# Patient Record
Sex: Male | Born: 1975 | Race: White | Hispanic: No | Marital: Married | Smoking: Never smoker
Health system: Southern US, Community
[De-identification: ages and names within clinical notes are randomized; demographics above are authoritative.]

## PROBLEM LIST (undated history)

## (undated) DIAGNOSIS — I10 Essential (primary) hypertension: Secondary | ICD-10-CM

## (undated) HISTORY — PX: FRACTURE SURGERY: SHX138

## (undated) HISTORY — DX: Essential (primary) hypertension: I10

## (undated) HISTORY — PX: KNEE SURGERY: SHX244

---

## 2007-03-16 ENCOUNTER — Emergency Department (HOSPITAL_COMMUNITY): Admission: EM | Admit: 2007-03-16 | Discharge: 2007-03-16 | Payer: Self-pay | Admitting: Emergency Medicine

## 2010-03-06 ENCOUNTER — Emergency Department (HOSPITAL_COMMUNITY): Admission: EM | Admit: 2010-03-06 | Discharge: 2010-03-07 | Payer: Self-pay | Admitting: Emergency Medicine

## 2010-05-25 ENCOUNTER — Emergency Department: Payer: Self-pay | Admitting: Internal Medicine

## 2012-06-05 ENCOUNTER — Emergency Department (HOSPITAL_COMMUNITY)
Admission: EM | Admit: 2012-06-05 | Discharge: 2012-06-05 | Disposition: A | Discharge status: Home / Self Care | Payer: BC Managed Care – PPO | Attending: Emergency Medicine | Admitting: Emergency Medicine

## 2012-06-05 ENCOUNTER — Emergency Department (HOSPITAL_COMMUNITY): Payer: BC Managed Care – PPO

## 2012-06-05 ENCOUNTER — Encounter (HOSPITAL_COMMUNITY): Payer: Self-pay | Admitting: Emergency Medicine

## 2012-06-05 DIAGNOSIS — Y92410 Unspecified street and highway as the place of occurrence of the external cause: Secondary | ICD-10-CM | POA: Insufficient documentation

## 2012-06-05 DIAGNOSIS — M25579 Pain in unspecified ankle and joints of unspecified foot: Secondary | ICD-10-CM | POA: Insufficient documentation

## 2012-06-05 MED ORDER — NAPROXEN 250 MG PO TABS
500.0000 mg | ORAL_TABLET | Freq: Once | ORAL | Status: AC
  Administered 2012-06-05: 500 mg via ORAL
  Filled 2012-06-05: qty 2

## 2012-06-05 MED ORDER — IBUPROFEN 800 MG PO TABS
800.0000 mg | ORAL_TABLET | Freq: Once | ORAL | Status: DC
  Filled 2012-06-05: qty 1

## 2012-06-05 MED ORDER — CYCLOBENZAPRINE HCL 10 MG PO TABS: 10.0000 mg | ORAL_TABLET | Freq: Two times a day (BID) | ORAL | Status: DC | PRN

## 2012-06-05 NOTE — ED Notes (Signed)
Pt returned from radiology at this time.  

## 2012-06-05 NOTE — ED Notes (Signed)
Matthew Endo,  PA at pt bedside.

## 2012-06-05 NOTE — ED Notes (Signed)
Pt to radiology via stretcher at this time.  
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
## 2012-06-06 NOTE — ED Provider Notes (Signed)
Medical screening examination/treatment/procedure(s) were performed by non-physician practitioner and as supervising physician I was immediately available for consultation/collaboration.  Raeford Razor, MD 06/06/12 [phone]

## 2017-04-20 ENCOUNTER — Ambulatory Visit: Payer: BLUE CROSS/BLUE SHIELD | Admitting: Family Medicine

## 2017-04-27 ENCOUNTER — Encounter: Payer: Self-pay | Admitting: *Deleted

## 2017-04-27 ENCOUNTER — Emergency Department
Admission: EM | Admit: 2017-04-27 | Discharge: 2017-04-27 | Disposition: A | Discharge status: Home / Self Care | Payer: BLUE CROSS/BLUE SHIELD | Attending: Emergency Medicine | Admitting: Emergency Medicine

## 2017-04-27 ENCOUNTER — Emergency Department: Payer: BLUE CROSS/BLUE SHIELD

## 2017-04-27 DIAGNOSIS — R079 Chest pain, unspecified: Secondary | ICD-10-CM

## 2017-04-27 DIAGNOSIS — Z79899 Other long term (current) drug therapy: Secondary | ICD-10-CM | POA: Diagnosis not present

## 2017-04-27 LAB — BASIC METABOLIC PANEL
ANION GAP: 8 (ref 5–15)
BUN: 12 mg/dL (ref 6–20)
CHLORIDE: 104 mmol/L (ref 101–111)
CO2: 25 mmol/L (ref 22–32)
Calcium: 9 mg/dL (ref 8.9–10.3)
Creatinine, Ser: 0.93 mg/dL (ref 0.61–1.24)
GFR calc Af Amer: >60 mL/min (ref >60)
Glucose, Bld: 92 mg/dL (ref 65–99)
POTASSIUM: 3.6 mmol/L (ref 3.5–5.1)
SODIUM: 137 mmol/L (ref 135–145)

## 2017-04-27 LAB — CBC
HEMATOCRIT: 42.8 % (ref 40.0–52.0)
HEMOGLOBIN: 14.9 g/dL (ref 13.0–18.0)
MCH: 31.7 pg (ref 26.0–34.0)
MCHC: 34.7 g/dL (ref 32.0–36.0)
MCV: 91.3 fL (ref 80.0–100.0)
Platelets: 210 10*3/uL (ref 150–440)
RBC: 4.68 MIL/uL (ref 4.40–5.90)
RDW: 12.6 % (ref 11.5–14.5)
WBC: 7.7 10*3/uL (ref 3.8–10.6)

## 2017-04-27 LAB — TROPONIN I: Troponin I: <0.03 ng/mL (ref <0.03)

## 2017-04-27 NOTE — ED Triage Notes (Signed)
States sharp mid chest pain that goes to his left arm and neck that began today, [place], states he also believes his BP was high with no hx, awake and alert in no acute distress

## 2017-04-27 NOTE — ED Provider Notes (Signed)
[facility] Emergency Department Provider Note  ____________________________________________  Time seen: Approximately 9:36 PM  I have reviewed the triage vital signs and the nursing notes.   HISTORY  Chief Complaint Chest Pain    HPI Matthew Hartman is a 41 y.o. male that presents to the emergency department for evaluation of chest pain for one day. He is not having any chest pain currently. Pain earlier was sharp and radiated into his left shoulder. He states that the pain felt like it was in his muscle. Pain was worse when he moved his left arm. He has 7 children and [place] and [place] the [place]. He also bench pressed earlier today. He had a lot of stress at work today. He was telling his Network engineer about his pain and states that the secretary is a hypochondriac and called his mother. His mother told him that he needed to come to the ER. He checks his blood pressures regularly and states they usually run in the 130s/70s-80s. He has no cardiac history. No headache, visual changes, shortness of breath, palpitations, nausea, vomiting, abdominal pain.   History reviewed. No pertinent past medical history.  There are no active problems to display for this patient.   Past Surgical History:  Procedure Laterality Date  . KNEE SURGERY      Prior to Admission medications   Medication Sig Start Date End Date Taking? Authorizing Provider  cyclobenzaprine (FLEXERIL) 10 MG tablet Take 1 tablet (10 mg total) by mouth 2 (two) times daily as needed for muscle spasms. 06/05/12   Domenic Moras, PA-C  Multiple Vitamin (MULTIVITAMIN WITH MINERALS) TABS Take 1 tablet by mouth daily.    [provider]    Allergies Ibuprofen  History reviewed. No pertinent family history.  Social History Social History  Substance Use Topics  . Smoking status: Never Smoker  . Smokeless tobacco: Not on file  . Alcohol use No     Review of  Systems  Constitutional: No fever/chills Cardiovascular: Positive for chest pain. Respiratory: No SOB. Gastrointestinal: No abdominal pain.  No nausea, no vomiting.  Skin: Negative for rash, abrasions, lacerations, ecchymosis. Neurological: Negative for headaches, numbness or tingling   ____________________________________________   PHYSICAL EXAM:  VITAL SIGNS: ED Triage Vitals  Enc Vitals Group     BP 04/27/17 1840 (!) 144/96     Pulse Rate 04/27/17 1840 69     Resp 04/27/17 1840 16     Temp 04/27/17 1840 98.6 F (37 C)     Temp Source 04/27/17 1840 Oral     SpO2 04/27/17 1840 97 %     Weight 04/27/17 1838 230 lb (104.3 kg)     Height 04/27/17 1838 5\' 11"  (1.803 m)     Head Circumference --      Peak Flow --      Pain Score 04/27/17 2023 0     Pain Loc --      Pain Edu? --      Excl. in [place]? --      Constitutional: Alert and oriented. Well appearing and in no acute distress. Eyes: Conjunctivae are normal. PERRL. EOMI. Head: Atraumatic. ENT:      Ears:      Nose: No congestion/rhinnorhea.      Mouth/Throat: Mucous membranes are moist.  Neck: No stridor.  Cardiovascular: Normal rate, regular rhythm.  Good peripheral circulation. Respiratory: Normal respiratory effort without tachypnea or retractions. Lungs CTAB. Good air entry to  the bases with no decreased or absent breath sounds. Gastrointestinal: Bowel sounds 4 quadrants. Soft and nontender to palpation. No guarding or rigidity. No palpable masses. No distention.  Musculoskeletal: Full range of motion to all extremities. No gross deformities appreciated. No chest wall tenderness to palpation. Neurologic:  Normal speech and language. No gross focal neurologic deficits are appreciated.  Skin:  Skin is warm, dry and intact. No rash noted.   ____________________________________________   LABS (all labs ordered are listed, but only abnormal results are displayed)  Labs Reviewed  BASIC METABOLIC PANEL  CBC   TROPONIN I  TROPONIN I   ____________________________________________  EKG   ____________________________________________  RADIOLOGY Robinette Haines, personally viewed and evaluated these images (plain radiographs) as part of my medical decision making, as well as reviewing the written report by the radiologist.  Dg Chest 2 View  Result Date: 04/27/2017 CLINICAL DATA:  Mid chest pain radiating to left arm and neck that began today. EXAM: CHEST  2 VIEW COMPARISON:  05/25/2010 CXR and left rib radiographs FINDINGS: The heart size and mediastinal contours are within normal limits. Both lungs are clear. Chronic healed ninth and tenth rib fractures. No acute osseous appearing abnormality. IMPRESSION: No active cardiopulmonary disease. Electronically Signed   By: Jazminn Pomales Royalty M.D.   On: 04/27/2017 19:06    ____________________________________________    PROCEDURES  Procedure(s) performed:    Procedures    Medications - No data to display   ____________________________________________   INITIAL IMPRESSION / ASSESSMENT AND PLAN / ED COURSE  Pertinent labs & imaging results that were available during my care of the patient were reviewed by me and considered in my medical decision making (see chart for details).  Review of the [place] CSRS was performed in accordance of the [place] prior to dispensing any controlled drugs.    Patient presented to the emergency department for evaluation of chest pain for one day. He has not had any chest pain while in the emergency department. Vital signs, labwork, and exam are reassuring. No troponin elevation. No changes on EKG. No acute cardiopulmonary processes on chest x-ray. Patient is to follow up with PCP as directed. Patient is given ED precautions to return to the ED for any worsening or new symptoms.     ____________________________________________  FINAL CLINICAL IMPRESSION(S) / ED DIAGNOSES  Final diagnoses:  Chest pain,  unspecified type      NEW MEDICATIONS STARTED DURING THIS VISIT:  Discharge Medication List as of 04/27/2017 10:34 PM          This chart was dictated using voice recognition software/Dragon. Despite best efforts to proofread, errors can occur which can change the meaning. Any change was purely unintentional.    Laban Emperor, PA-C 04/27/17 [place], Randall An, MD 04/28/17 [phone]

## 2017-10-14 DIAGNOSIS — Z302 Encounter for sterilization: Secondary | ICD-10-CM | POA: Diagnosis not present

## 2017-10-14 DIAGNOSIS — Z3009 Encounter for other general counseling and advice on contraception: Secondary | ICD-10-CM | POA: Diagnosis not present

## 2017-11-03 DIAGNOSIS — L720 Epidermal cyst: Secondary | ICD-10-CM | POA: Diagnosis not present

## 2017-11-09 DIAGNOSIS — L720 Epidermal cyst: Secondary | ICD-10-CM | POA: Diagnosis not present

## 2017-12-31 ENCOUNTER — Other Ambulatory Visit: Payer: Self-pay

## 2017-12-31 ENCOUNTER — Emergency Department
Admission: EM | Admit: 2017-12-31 | Discharge: 2017-12-31 | Disposition: A | Discharge status: Home / Self Care | Payer: BLUE CROSS/BLUE SHIELD | Attending: Emergency Medicine | Admitting: Emergency Medicine

## 2017-12-31 ENCOUNTER — Emergency Department: Payer: BLUE CROSS/BLUE SHIELD

## 2017-12-31 ENCOUNTER — Encounter: Payer: Self-pay | Admitting: Emergency Medicine

## 2017-12-31 DIAGNOSIS — E785 Hyperlipidemia, unspecified: Secondary | ICD-10-CM | POA: Diagnosis not present

## 2017-12-31 DIAGNOSIS — R110 Nausea: Secondary | ICD-10-CM | POA: Diagnosis not present

## 2017-12-31 DIAGNOSIS — R079 Chest pain, unspecified: Secondary | ICD-10-CM | POA: Insufficient documentation

## 2017-12-31 LAB — BASIC METABOLIC PANEL
Anion gap: 5 (ref 5–15)
BUN: 18 mg/dL (ref 6–20)
CALCIUM: 8.8 mg/dL — AB (ref 8.9–10.3)
CHLORIDE: 109 mmol/L (ref 101–111)
CO2: 26 mmol/L (ref 22–32)
CREATININE: 1.07 mg/dL (ref 0.61–1.24)
GFR calc non Af Amer: >60 mL/min (ref >60)
Glucose, Bld: 107 mg/dL — ABNORMAL HIGH (ref 65–99)
Potassium: 3.9 mmol/L (ref 3.5–5.1)
Sodium: 140 mmol/L (ref 135–145)

## 2017-12-31 LAB — CBC
HEMATOCRIT: 43.6 % (ref 40.0–52.0)
HEMOGLOBIN: 15 g/dL (ref 13.0–18.0)
MCH: 31.4 pg (ref 26.0–34.0)
MCHC: 34.4 g/dL (ref 32.0–36.0)
MCV: 91.2 fL (ref 80.0–100.0)
Platelets: 200 10*3/uL (ref 150–440)
RBC: 4.78 MIL/uL (ref 4.40–5.90)
RDW: 13.1 % (ref 11.5–14.5)
WBC: 7.1 10*3/uL (ref 3.8–10.6)

## 2017-12-31 LAB — LIPID PANEL
CHOL/HDL RATIO: 7.2 ratio
Cholesterol: 252 mg/dL — ABNORMAL HIGH (ref 0–200)
HDL: 35 mg/dL — AB (ref >40)
LDL Cholesterol: 189 mg/dL — ABNORMAL HIGH (ref 0–99)
TRIGLYCERIDES: 141 mg/dL (ref <150)
VLDL: 28 mg/dL (ref 0–40)

## 2017-12-31 LAB — TROPONIN I: Troponin I: <0.03 ng/mL (ref <0.03)

## 2017-12-31 MED ORDER — ATORVASTATIN CALCIUM 10 MG PO TABS: 10.0000 mg | ORAL_TABLET | Freq: Every day | ORAL | 11 refills | Status: DC

## 2017-12-31 NOTE — ED Provider Notes (Addendum)
[facility] Emergency Department Provider Note       Time seen: ----------------------------------------- 7:24 AM on 12/31/2017 -----------------------------------------   I have reviewed the triage vital signs and the nursing notes.  HISTORY   Chief Complaint Chest Pain    HPI Matthew Hartman is a 42 y.o. male with no significant past medical history who presents to the ED for chest pain.  Patient states he woke up with chest pain with some cold sweats and some nausea.  He is not sure if he had some arm pain as well.  He states he was having chest pain yesterday but no other associated symptoms, though started upon awakening about 430 this morning.  He denies any significant discomfort now.    History reviewed. No pertinent past medical history.  There are no active problems to display for this patient.   Past Surgical History:  Procedure Laterality Date  . KNEE SURGERY      Allergies Ibuprofen  Social History Social History   Tobacco Use  . Smoking status: Never Smoker  . Smokeless tobacco: Never Used  Substance Use Topics  . Alcohol use: No  . Drug use: No   Review of Systems Constitutional: Negative for fever. Cardiovascular: Positive for chest pain Respiratory: Negative for shortness of breath. Gastrointestinal: Negative for abdominal pain, positive for recent nausea Musculoskeletal: Negative for back pain. Skin: Negative for rash. Neurological: Negative for headaches, focal weakness or numbness.  All systems negative/normal/unremarkable except as stated in the HPI  ____________________________________________   PHYSICAL EXAM:  VITAL SIGNS: ED Triage Vitals  Enc Vitals Group     BP 12/31/17 0522 (!) 132/92     Pulse Rate 12/31/17 0522 70     Resp 12/31/17 0522 18     Temp 12/31/17 0522 97.7 F (36.5 C)     Temp Source 12/31/17 0522 Oral     SpO2 12/31/17 0522 98 %     Weight 12/31/17 0521 230 lb (104.3 kg)      Height 12/31/17 0521 5\' 11"  (1.803 m)     Head Circumference --      Peak Flow --      Pain Score 12/31/17 0520 1     Pain Loc --      Pain Edu? --      Excl. in [place]? --    Constitutional: Alert and oriented. Well appearing and in no distress. Eyes: Conjunctivae are normal. Normal extraocular movements. ENT   Head: Normocephalic and atraumatic.   Nose: No congestion/rhinnorhea.   Mouth/Throat: Mucous membranes are moist.   Neck: No stridor. Cardiovascular: Normal rate, regular rhythm. No murmurs, rubs, or gallops. Respiratory: Normal respiratory effort without tachypnea nor retractions. Breath sounds are clear and equal bilaterally. No wheezes/rales/rhonchi. Gastrointestinal: Soft and nontender. Normal bowel sounds Musculoskeletal: Nontender with normal range of motion in extremities. No lower extremity tenderness nor edema. Neurologic:  Normal speech and language. No gross focal neurologic deficits are appreciated.  Skin:  Skin is warm, dry and intact. No rash noted. Psychiatric: Mood and affect are normal. Speech and behavior are normal.  ____________________________________________  EKG: Interpreted by me.  Sinus rhythm the rate is 68 bpm, normal PR interval, normal QRS, normal QT.  ____________________________________________  ED COURSE:  As part of my medical decision making, I reviewed the following data within the [place] History obtained from family if available, nursing notes, old chart and ekg, as well as notes from prior ED visits. Patient presented for  chest pain, we will assess with labs and imaging as indicated at this time.   Procedures ____________________________________________   LABS (pertinent positives/negatives)  Labs Reviewed  BASIC METABOLIC PANEL - Abnormal; Notable for the following components:      Result Value   Glucose, Bld 107 (*)    Calcium 8.8 (*)    All other components within normal limits  LIPID PANEL -  Abnormal; Notable for the following components:   Cholesterol 252 (*)    HDL 35 (*)    LDL Cholesterol 189 (*)    All other components within normal limits  CBC  TROPONIN I  TROPONIN I    RADIOLOGY  Chest x-ray is normal  ____________________________________________  DIFFERENTIAL DIAGNOSIS   Musculoskeletal pain, GERD, anxiety, unstable angina unlikely  FINAL ASSESSMENT AND PLAN  Chest pain, hyperlipidemia   Plan: The patient had presented for nonspecific chest pain. Patient's labs are reassuring including repeat troponin but he does have evidence of hyperlipidemia.  Patient's imaging was negative for acute process.  He is low risk for ACS, but I will refer him to cardiology for outpatient follow-up and I will prescribe a statin for him to take.   Laurence Aly, MD   Note: This note was generated in part or whole with voice recognition software. Voice recognition is usually quite accurate but there are transcription errors that can and very often do occur. I apologize for any typographical errors that were not detected and corrected.     Earleen Newport, MD 12/31/17 [id number]    Earleen Newport, MD 12/31/17 [phone]

## 2017-12-31 NOTE — ED Triage Notes (Signed)
Patient ambulatory to triage with steady gait, without difficulty or distress noted; pt reports left sided CP radiating to left shoulder x month accomp by diaphoresis and nausea; hx of same with normal findings

## 2018-04-15 DIAGNOSIS — H15101 Unspecified episcleritis, right eye: Secondary | ICD-10-CM | POA: Diagnosis not present

## 2018-09-24 DIAGNOSIS — M1712 Unilateral primary osteoarthritis, left knee: Secondary | ICD-10-CM | POA: Diagnosis not present

## 2018-10-24 DIAGNOSIS — J101 Influenza due to other identified influenza virus with other respiratory manifestations: Secondary | ICD-10-CM | POA: Diagnosis not present

## 2018-11-04 DIAGNOSIS — D485 Neoplasm of uncertain behavior of skin: Secondary | ICD-10-CM | POA: Diagnosis not present

## 2018-11-04 DIAGNOSIS — D1800 Hemangioma unspecified site: Secondary | ICD-10-CM | POA: Diagnosis not present

## 2018-11-04 DIAGNOSIS — Z1283 Encounter for screening for malignant neoplasm of skin: Secondary | ICD-10-CM | POA: Diagnosis not present

## 2018-11-04 DIAGNOSIS — L578 Other skin changes due to chronic exposure to nonionizing radiation: Secondary | ICD-10-CM | POA: Diagnosis not present

## 2019-06-24 ENCOUNTER — Emergency Department: Payer: BC Managed Care – PPO

## 2019-06-24 ENCOUNTER — Other Ambulatory Visit: Payer: Self-pay

## 2019-06-24 ENCOUNTER — Encounter: Payer: Self-pay | Admitting: Emergency Medicine

## 2019-06-24 ENCOUNTER — Emergency Department
Admission: EM | Admit: 2019-06-24 | Discharge: 2019-06-24 | Disposition: A | Discharge status: Home / Self Care | Payer: BC Managed Care – PPO | Attending: Emergency Medicine | Admitting: Emergency Medicine

## 2019-06-24 DIAGNOSIS — N210 Calculus in bladder: Secondary | ICD-10-CM | POA: Diagnosis not present

## 2019-06-24 DIAGNOSIS — Z79899 Other long term (current) drug therapy: Secondary | ICD-10-CM | POA: Insufficient documentation

## 2019-06-24 DIAGNOSIS — N200 Calculus of kidney: Secondary | ICD-10-CM | POA: Diagnosis not present

## 2019-06-24 DIAGNOSIS — R1031 Right lower quadrant pain: Secondary | ICD-10-CM | POA: Diagnosis not present

## 2019-06-24 LAB — CBC WITH DIFFERENTIAL/PLATELET
Abs Immature Granulocytes: 0.01 10*3/uL (ref 0.00–0.07)
Basophils Absolute: 0 10*3/uL (ref 0.0–0.1)
Basophils Relative: 1 %
Eosinophils Absolute: 0.1 10*3/uL (ref 0.0–0.5)
Eosinophils Relative: 1 %
HCT: 46.6 % (ref 39.0–52.0)
Hemoglobin: 15.8 g/dL (ref 13.0–17.0)
Immature Granulocytes: 0 %
Lymphocytes Relative: 39 %
Lymphs Abs: 2.9 10*3/uL (ref 0.7–4.0)
MCH: 30.3 pg (ref 26.0–34.0)
MCHC: 33.9 g/dL (ref 30.0–36.0)
MCV: 89.3 fL (ref 80.0–100.0)
Monocytes Absolute: 0.6 10*3/uL (ref 0.1–1.0)
Monocytes Relative: 8 %
Neutro Abs: 3.7 10*3/uL (ref 1.7–7.7)
Neutrophils Relative %: 51 %
Platelets: 232 10*3/uL (ref 150–400)
RBC: 5.22 MIL/uL (ref 4.22–5.81)
RDW: 12.2 % (ref 11.5–15.5)
WBC: 7.3 10*3/uL (ref 4.0–10.5)
nRBC: 0 % (ref 0.0–0.2)

## 2019-06-24 LAB — COMPREHENSIVE METABOLIC PANEL
ALT: 26 U/L (ref 0–44)
AST: 25 U/L (ref 15–41)
Albumin: 4.6 g/dL (ref 3.5–5.0)
Alkaline Phosphatase: 57 U/L (ref 38–126)
Anion gap: 8 (ref 5–15)
BUN: 20 mg/dL (ref 6–20)
CO2: 26 mmol/L (ref 22–32)
Calcium: 9.2 mg/dL (ref 8.9–10.3)
Chloride: 105 mmol/L (ref 98–111)
Creatinine, Ser: 1.23 mg/dL (ref 0.61–1.24)
GFR calc Af Amer: >60 mL/min (ref >60)
GFR calc non Af Amer: >60 mL/min (ref >60)
Glucose, Bld: 118 mg/dL — ABNORMAL HIGH (ref 70–99)
Potassium: 4.4 mmol/L (ref 3.5–5.1)
Sodium: 139 mmol/L (ref 135–145)
Total Bilirubin: 0.6 mg/dL (ref 0.3–1.2)
Total Protein: 7.6 g/dL (ref 6.5–8.1)

## 2019-06-24 LAB — LIPASE, BLOOD: Lipase: 29 U/L (ref 11–51)

## 2019-06-24 MED ORDER — SODIUM CHLORIDE 0.9 % IV BOLUS
1000.0000 mL | Freq: Once | INTRAVENOUS | Status: AC
  Administered 2019-06-24: 09:00:00 1000 mL via INTRAVENOUS

## 2019-06-24 MED ORDER — ONDANSETRON HCL 4 MG/2ML IJ SOLN
4.0000 mg | Freq: Once | INTRAMUSCULAR | Status: AC
Start: 2019-06-24 — End: 2019-06-24
  Administered 2019-06-24: 4 mg via INTRAVENOUS
  Filled 2019-06-24: qty 2

## 2019-06-24 MED ORDER — OXYCODONE HCL 5 MG PO TABS: 5.0000 mg | ORAL_TABLET | Freq: Three times a day (TID) | ORAL | 0 refills | Status: AC | PRN

## 2019-06-24 MED ORDER — TAMSULOSIN HCL 0.4 MG PO CAPS: 0.4000 mg | ORAL_CAPSULE | Freq: Every day | ORAL | 0 refills | Status: AC

## 2019-06-24 MED ORDER — HYDROMORPHONE HCL 1 MG/ML IJ SOLN
1.0000 mg | Freq: Once | INTRAMUSCULAR | Status: AC
  Administered 2019-06-24: 09:00:00 1 mg via INTRAVENOUS
  Filled 2019-06-24: qty 1

## 2019-06-24 MED ORDER — ONDANSETRON HCL 4 MG PO TABS: 4.0000 mg | ORAL_TABLET | Freq: Every day | ORAL | 0 refills | Status: DC | PRN

## 2019-06-24 NOTE — ED Notes (Signed)
Patient transported to CT 

## 2019-06-24 NOTE — ED Provider Notes (Signed)
[facility] Emergency Department Provider Note  ____________________________________________   First MD Initiated Contact with Patient 06/24/19 [phone]     (approximate)  I have reviewed the triage vital signs and the nursing notes.   HISTORY  Chief Complaint Flank Pain    HPI Matthew Hartman is a 43 y.o. male otherwise healthy who presents with concerns for kidney stone.  Patient had sudden onset of right flank pain that started this morning that is severe, intermittent, occasionally gets worse on its own, nothing makes it better.  He denies any vomiting but has had some nausea.  He endorses pain that radiates down into his penis but no testicle pain.  He denies any hematuria.  He says that he thinks he had a kidney stone previously but was never seen by a doctor for it.  He denies any fevers.     History reviewed. No pertinent past medical history.  There are no active problems to display for this patient.   Past Surgical History:  Procedure Laterality Date   KNEE SURGERY      Prior to Admission medications   Medication Sig Start Date End Date Taking? Authorizing Provider  atorvastatin (LIPITOR) 10 MG tablet Take 1 tablet (10 mg total) by mouth daily. 12/31/17 12/31/18  Earleen Newport, MD  cyclobenzaprine (FLEXERIL) 10 MG tablet Take 1 tablet (10 mg total) by mouth 2 (two) times daily as needed for muscle spasms. 06/05/12   Domenic Moras, PA-C  Multiple Vitamin (MULTIVITAMIN WITH MINERALS) TABS Take 1 tablet by mouth daily.    [provider]    Allergies Ibuprofen  History reviewed. No pertinent family history.  Social History Social History   Tobacco Use   Smoking status: Never Smoker   Smokeless tobacco: Never Used  Substance Use Topics   Alcohol use: No   Drug use: No      Review of Systems Constitutional: No fever/chills Eyes: No visual changes. ENT: No sore throat. Cardiovascular: Denies chest  pain. Respiratory: Denies shortness of breath. Gastrointestinal: Positive abdominal and right flank pain. Genitourinary: Negative for dysuria. Musculoskeletal: Negative for back pain. Skin: Negative for rash. Neurological: Negative for headaches, focal weakness or numbness. All other ROS negative ____________________________________________   PHYSICAL EXAM:  VITAL SIGNS: ED Triage Vitals  Enc Vitals Group     BP --      Pulse Rate 06/24/19 0828 90     Resp 06/24/19 0828 16     Temp 06/24/19 0828 98.5 F (36.9 C)     Temp Source 06/24/19 0828 Oral     SpO2 06/24/19 0828 99 %     Weight 06/24/19 0827 220 lb (99.8 kg)     Height --      Head Circumference --      Peak Flow --      Pain Score 06/24/19 0827 10     Pain Loc --      Pain Edu? --      Excl. in [place]? --     Constitutional: Alert and oriented. Well appearing but does appear in pain Eyes: Conjunctivae are normal. EOMI. Head: Atraumatic. Nose: No congestion/rhinnorhea. Mouth/Throat: Mucous membranes are moist.   Neck: No stridor. Trachea Midline. FROM Cardiovascular: Normal rate, regular rhythm. Grossly normal heart sounds.  Good peripheral circulation. Respiratory: Normal respiratory effort.  No retractions. Lungs CTAB. Gastrointestinal: Soft.  Tenderness in the right lower abdomen and right flank.  No distention. No abdominal bruits.  Musculoskeletal: No lower extremity  tenderness nor edema.  No joint effusions. Neurologic:  Normal speech and language. No gross focal neurologic deficits are appreciated.  Skin:  Skin is warm, dry and intact. No rash noted. Psychiatric: Mood and affect are normal. Speech and behavior are normal. GU: Deferred   ____________________________________________   LABS (all labs ordered are listed, but only abnormal results are displayed)  Labs Reviewed  COMPREHENSIVE METABOLIC PANEL - Abnormal; Notable for the following components:      Result Value   Glucose, Bld 118 (*)    All  other components within normal limits  CBC WITH DIFFERENTIAL/PLATELET  LIPASE, BLOOD  URINALYSIS, ROUTINE W REFLEX MICROSCOPIC   ____________________________________________ RADIOLOGY   Official radiology report(s): Ct Renal Stone Study  Result Date: 06/24/2019 CLINICAL DATA:  43 year old male with history of acute onset of right-sided flank pain radiating into the right lower quadrant. EXAM: CT ABDOMEN AND PELVIS WITHOUT CONTRAST TECHNIQUE: Multidetector CT imaging of the abdomen and pelvis was performed following the standard protocol without IV contrast. COMPARISON:  No priors. FINDINGS: Lower chest: Unremarkable. Hepatobiliary: No definite cystic or solid hepatic lesions are confidently identified on today's noncontrast CT examination. Unenhanced appearance of the gallbladder is normal. Pancreas: No definite pancreatic mass or peripancreatic fluid collections or inflammatory changes noted on today's noncontrast CT examination. Spleen: Unremarkable. Adrenals/Urinary Tract: 3 mm nonobstructive calculus in the interpolar collecting system of the right kidney. No additional calculi are noted within the collecting system of the left kidney or along the course of either ureter. 3 mm calculus lying dependently in the left side of the urinary bladder. Urinary bladder is otherwise unremarkable in appearance. Unenhanced appearance of the kidneys and bilateral adrenal glands is otherwise unremarkable. Stomach/Bowel: Unenhanced appearance of the stomach is normal. No pathologic dilatation of small bowel or colon. Normal appendix. Vascular/Lymphatic: No atherosclerotic calcifications in the abdominal aorta or pelvic vasculature. No lymphadenopathy noted in the abdomen or pelvis. Reproductive: Prostate gland and seminal vesicles are unremarkable in appearance. Other: No significant volume of ascites.  No pneumoperitoneum. Musculoskeletal: There are no aggressive appearing lytic or blastic lesions noted in the  visualized portions of the skeleton. IMPRESSION: 1. 3 mm nonobstructive calculus in the interpolar collecting system of the right kidney. 2. 3 mm calculus lying dependently in the urinary bladder. 3. No ureteral stones or findings of urinary tract obstruction are noted at this time. Electronically Signed   By: Vinnie Langton M.D.   On: 06/24/2019 09:04    ____________________________________________   PROCEDURES  Procedure(s) performed (including Critical Care):  Procedures   ____________________________________________   INITIAL IMPRESSION / ASSESSMENT AND PLAN / ED COURSE  MAVRIK FREIMARK was evaluated in Emergency Department on 06/24/2019 for the symptoms described in the history of present illness. He was evaluated in the context of the global COVID-19 pandemic, which necessitated consideration that the patient might be at risk for infection with the SARS-CoV-2 virus that causes COVID-19. Institutional protocols and algorithms that pertain to the evaluation of patients at risk for COVID-19 are in a state of rapid change based on information released by regulatory bodies including the CDC and federal and state organizations. These policies and algorithms were followed during the patient's care in the ED.     Patient is a 43 year old who presents with right flank pain intermittently radiating down into his penile region.  Patient does appear uncomfortable.  Patient slightly hypertensive most likely secondary to pain.  Will control patient's pain and do a CT renal given no prior history  of kidney stones diagnosed by a doctor.  Will get urine to evaluate for hematuria, UTI.  Will get electrolytes and kidney function as well to evaluate for AKI.  Labs are reassuring with normal kidney function.  CT scan shows a 3 mm stone in the bladder.  On reassessment of patient he says that his pain resolved prior prior to getting the pain medicine and his pain is now completely resolved.  I explained  that he most likely is feeling better given the stone has not moved into the bladder.  He has not given a urine but declined staying for giving a urine analysis.  He denies any dysuria or hematuria.  He he says that he would prefer to go home given his pain is now resolved.  We will give a course of oxycodone and nausea medicine in case he has additional pain as it transverses the urethra versus the other kidney stone starts to move.  Patient given urology's phone number for follow-up as needed.  I discussed the provisional nature of ED diagnosis, the treatment so far, the ongoing plan of care, follow up appointments and return precautions with the patient and any family or support people present. They expressed understanding and agreed with the plan, discharged home.       ____________________________________________   FINAL CLINICAL IMPRESSION(S) / ED DIAGNOSES   Final diagnoses:  Kidney stone      MEDICATIONS GIVEN DURING THIS VISIT:  Medications  HYDROmorphone (DILAUDID) injection 1 mg (1 mg Intravenous Given 06/24/19 0845)  ondansetron (ZOFRAN) injection 4 mg (4 mg Intravenous Given 06/24/19 0845)  sodium chloride 0.9 % bolus 1,000 mL (1,000 mLs Intravenous New Bag/Given 06/24/19 0844)     ED Discharge Orders         Ordered    oxyCODONE (ROXICODONE) 5 MG immediate release tablet  Every 8 hours PRN     06/24/19 1014    ondansetron (ZOFRAN) 4 MG tablet  Daily PRN     06/24/19 1014    tamsulosin (FLOMAX) 0.4 MG CAPS capsule  Daily     06/24/19 1014           Note:  This document was prepared using Dragon voice recognition software and may include unintentional dictation errors.   Vanessa , MD 06/24/19 [phone]

## 2019-06-24 NOTE — ED Triage Notes (Signed)
Pt here for acute onset right flank pain radiating to RLQ. Hx stones, feels same. + nausea, no vomiting.  No fevers. Appears in pain.

## 2019-06-24 NOTE — Discharge Instructions (Addendum)
You most likely passed a kidney stone.  We are giving you oxycodone to help with pain.  Zofran help with nausea.  The tamsulosin to help with dilated urethra.  Return to the ER for pain with urination, fevers or vomiting or any other concerns.      1. 3 mm nonobstructive calculus in the interpolar collecting system  of the right kidney.  2. 3 mm calculus lying dependently in the urinary bladder.  3. No ureteral stones or findings of urinary tract obstruction are  noted at this time.

## 2019-07-14 ENCOUNTER — Telehealth: Payer: Self-pay | Admitting: Family Medicine

## 2019-07-14 NOTE — Telephone Encounter (Signed)
Called number in chart as well as number in message, no answer. Will try again.

## 2019-07-14 NOTE — Telephone Encounter (Signed)
Copied from [place] [id number]. Topic: Appointment Scheduling - Scheduling Inquiry for Clinic >> Jul 06, 2019 10:52 AM Scherrie Gerlach wrote: Reason for CRM: sister Casandra Doffing sees dr Wynetta Emery along with her husband and dad Dacotah Goad.  She would like to know if Dr Wynetta Emery will make an exception and accept this pt, her brother as a pt as well. Ok to call sis t schedule appt

## 2019-07-14 NOTE — Telephone Encounter (Signed)
That's fine

## 2019-07-21 ENCOUNTER — Ambulatory Visit (INDEPENDENT_AMBULATORY_CARE_PROVIDER_SITE_OTHER): Payer: BC Managed Care – PPO | Admitting: Family Medicine

## 2019-07-21 ENCOUNTER — Encounter: Payer: Self-pay | Admitting: Family Medicine

## 2019-07-21 ENCOUNTER — Other Ambulatory Visit: Payer: Self-pay

## 2019-07-21 VITALS — BP 150/86 | Ht 70.0 in | Wt 225.0 lb

## 2019-07-21 DIAGNOSIS — I10 Essential (primary) hypertension: Secondary | ICD-10-CM | POA: Insufficient documentation

## 2019-07-21 DIAGNOSIS — Z833 Family history of diabetes mellitus: Secondary | ICD-10-CM

## 2019-07-21 DIAGNOSIS — Z87442 Personal history of urinary calculi: Secondary | ICD-10-CM | POA: Insufficient documentation

## 2019-07-21 DIAGNOSIS — E785 Hyperlipidemia, unspecified: Secondary | ICD-10-CM | POA: Insufficient documentation

## 2019-07-21 DIAGNOSIS — E782 Mixed hyperlipidemia: Secondary | ICD-10-CM

## 2019-07-21 MED ORDER — LISINOPRIL 10 MG PO TABS: 10.0000 mg | ORAL_TABLET | Freq: Every day | ORAL | 2 refills | Status: DC

## 2019-07-21 NOTE — Assessment & Plan Note (Signed)
No issues at this time. Will check urine. Call with any concerns.

## 2019-07-21 NOTE — Assessment & Plan Note (Signed)
Not under good control. Will increase to 10mg  and recheck 1 month. Call with any concerns. Continue to monitor.

## 2019-07-21 NOTE — Progress Notes (Signed)
BP (!) 150/86   Ht _0  (1.778 m)   Wt 225 lb (102.1 kg)   BMI 32.28 kg/m    Subjective:    Patient ID: Matthew Hartman, male    DOB: 02-Oct-1975, 43 y.o.   MRN: [id number]  HPI: [name] is a 43 y.o. male who presents today to establish care. He has not seen a doctor in a while. He has not seen a doctor regularly in about 24 years. He usually goes to urgent care.  Chief Complaint  Patient presents with  . Establish Care  . Hypertension    Patient states that with the 31m Lisinopril it usually runs about 135/90   HYPERTENSION / HYPERLIPIDEMIA- has not taken his blood pressure medicine this AM.he notes that about a year ago he woke up with chest pain so he went to the ER. They started him on some lisinopril and atorvastatin. He never took the atorvastatin Satisfied with current treatment? yes Duration of hypertension: chronic BP monitoring frequency: a few times a week- in the 150s/90s BP medication side effects: no Past BP meds: lisinopril Duration of hyperlipidemia: chronic Cholesterol medication side effects: never taken any Cholesterol supplements: none Past cholesterol medications: none Medication compliance: good compliance Aspirin: no Recent stressors: yes Recurrent headaches: no Visual changes: no Palpitations: no Dyspnea: no Chest pain: no Lower extremity edema: no Dizzy/lightheaded: no  Active Ambulatory Problems    Diagnosis Date Noted  . HTN (hypertension) 07/21/2019  . HLD (hyperlipidemia) 07/21/2019  . History of kidney stones 07/21/2019   Resolved Ambulatory Problems    Diagnosis Date Noted  . No Resolved Ambulatory Problems   Past Medical History:  Diagnosis Date  . Hypertension    Past Surgical History:  Procedure Laterality Date  . FRACTURE SURGERY Right    Has pins  . KNEE SURGERY     Outpatient Encounter Medications as of 07/21/2019  Medication Sig  . lisinopril (ZESTRIL) 10 MG tablet Take 1 tablet (10 mg total) by mouth  daily.  . [DISCONTINUED] atorvastatin (LIPITOR) 10 MG tablet Take 1 tablet (10 mg total) by mouth daily.  . [DISCONTINUED] cyclobenzaprine (FLEXERIL) 10 MG tablet Take 1 tablet (10 mg total) by mouth 2 (two) times daily as needed for muscle spasms.  . [DISCONTINUED] lisinopril (ZESTRIL) 5 MG tablet Take 5 mg by mouth daily.  . [DISCONTINUED] Multiple Vitamin (MULTIVITAMIN WITH MINERALS) TABS Take 1 tablet by mouth daily.  . [DISCONTINUED] ondansetron (ZOFRAN) 4 MG tablet Take 1 tablet (4 mg total) by mouth daily as needed for nausea or vomiting.   No facility-administered encounter medications on file as of 07/21/2019.    Allergies  Allergen Reactions  . Ibuprofen Itching and Swelling   Social History   Socioeconomic History  . Marital status: Married    Spouse name: Not on file  . Number of children: Not on file  . Years of education: Not on file  . Highest education level: Not on file  Occupational History  . Not on file  Social Needs  . Financial resource strain: Not on file  . Food insecurity    Worry: Not on file    Inability: Not on file  . Transportation needs    Medical: Not on file    Non-medical: Not on file  Tobacco Use  . Smoking status: Never Smoker  . Smokeless tobacco: Never Used  Substance and Sexual Activity  . Alcohol use: No  . Drug use: No  . Sexual activity: Yes  Birth control/protection: Surgical  Lifestyle  . Physical activity    Days per week: Not on file    Minutes per session: Not on file  . Stress: Not on file  Relationships  . Social Herbalist on phone: Not on file    Gets together: Not on file    Attends religious service: Not on file    Active member of club or organization: Not on file    Attends meetings of clubs or organizations: Not on file    Relationship status: Not on file  Other Topics Concern  . Not on file  Social History Narrative  . Not on file   Family History  Problem Relation Age of Onset  .  Hypertension Mother   . Hyperlipidemia Mother   . Hypertension Father   . Heart attack Father   . Hyperlipidemia Father   . Diabetes Sister   . Heart attack Maternal Grandfather   . Diabetes Maternal Grandfather   . COPD Paternal Grandmother   . Cancer Paternal Grandfather        Stomach  . Down syndrome Son     Review of Systems  Constitutional: Negative.   Respiratory: Negative.   Cardiovascular: Negative.   Musculoskeletal: Negative.   Skin: Negative.   Psychiatric/Behavioral: Negative.     Per HPI unless specifically indicated above     Objective:    BP (!) 150/86   Ht _0  (1.778 m)   Wt 225 lb (102.1 kg)   BMI 32.28 kg/m   Wt Readings from Last 3 Encounters:  07/21/19 225 lb (102.1 kg)  06/24/19 220 lb (99.8 kg)  12/31/17 230 lb (104.3 kg)    Physical Exam Vitals signs and nursing note reviewed.  Constitutional:      General: He is not in acute distress.    Appearance: Normal appearance. He is not ill-appearing, toxic-appearing or diaphoretic.  HENT:     Head: Normocephalic and atraumatic.     Right Ear: External ear normal.     Left Ear: External ear normal.     Nose: Nose normal.     Mouth/Throat:     Mouth: Mucous membranes are moist.     Pharynx: Oropharynx is clear.  Eyes:     General: No scleral icterus.       Right eye: No discharge.        Left eye: No discharge.     Conjunctiva/sclera: Conjunctivae normal.     Pupils: Pupils are equal, round, and reactive to light.  Neck:     Musculoskeletal: Normal range of motion.  Pulmonary:     Effort: Pulmonary effort is normal. No respiratory distress.     Comments: Speaking in full sentences Musculoskeletal: Normal range of motion.  Skin:    Coloration: Skin is not jaundiced or pale.     Findings: No bruising, erythema, lesion or rash.  Neurological:     Mental Status: He is alert and oriented to person, place, and time. Mental status is at baseline.  Psychiatric:        Mood and Affect:  Mood normal.        Behavior: Behavior normal.        Thought Content: Thought content normal.        Judgment: Judgment normal.     Results for orders placed or performed during the hospital encounter of 06/24/19  CBC with Differential  Result Value Ref Range   WBC 7.3 4.0 - 10.5  K/uL   RBC 5.22 4.22 - 5.81 MIL/uL   Hemoglobin 15.8 13.0 - 17.0 g/dL   HCT 46.6 39.0 - 52.0 %   MCV 89.3 80.0 - 100.0 fL   MCH 30.3 26.0 - 34.0 pg   MCHC 33.9 30.0 - 36.0 g/dL   RDW 12.2 11.5 - 15.5 %   Platelets 232 150 - 400 K/uL   nRBC 0.0 0.0 - 0.2 %   Neutrophils Relative % 51 %   Neutro Abs 3.7 1.7 - 7.7 K/uL   Lymphocytes Relative 39 %   Lymphs Abs 2.9 0.7 - 4.0 K/uL   Monocytes Relative 8 %   Monocytes Absolute 0.6 0.1 - 1.0 K/uL   Eosinophils Relative 1 %   Eosinophils Absolute 0.1 0.0 - 0.5 K/uL   Basophils Relative 1 %   Basophils Absolute 0.0 0.0 - 0.1 K/uL   Immature Granulocytes 0 %   Abs Immature Granulocytes 0.01 0.00 - 0.07 K/uL  Comprehensive metabolic panel  Result Value Ref Range   Sodium 139 135 - 145 mmol/L   Potassium 4.4 3.5 - 5.1 mmol/L   Chloride 105 98 - 111 mmol/L   CO2 26 22 - 32 mmol/L   Glucose, Bld 118 (H) 70 - 99 mg/dL   BUN 20 6 - 20 mg/dL   Creatinine, Ser 1.23 0.61 - 1.24 mg/dL   Calcium 9.2 8.9 - 10.3 mg/dL   Total Protein 7.6 6.5 - 8.1 g/dL   Albumin 4.6 3.5 - 5.0 g/dL   AST 25 15 - 41 U/L   ALT 26 0 - 44 U/L   Alkaline Phosphatase 57 38 - 126 U/L   Total Bilirubin 0.6 0.3 - 1.2 mg/dL   GFR calc non Af Amer >60 >60 mL/min   GFR calc Af Amer >60 >60 mL/min   Anion gap 8 5 - 15  Lipase, blood  Result Value Ref Range   Lipase 29 11 - 51 U/L      Assessment & Plan:   Problem List Items Addressed This Visit      Cardiovascular and Mediastinum   HTN (hypertension) - Primary    Not under good control. Will increase to 50m and recheck 1 month. Call with any concerns. Continue to monitor.       Relevant Medications   lisinopril (ZESTRIL) 10 MG  tablet   Other Relevant Orders   CBC with Differential OUT   Comp Met (CMET)   Microalbumin, Urine Waived   TSH     Other   HLD (hyperlipidemia)    Will recheck labs and treat as needed. Call with any concerns. Continue to monitor.       Relevant Medications   lisinopril (ZESTRIL) 10 MG tablet   Other Relevant Orders   CBC with Differential OUT   Comp Met (CMET)   Lipid Panel w/o Chol/HDL Ratio OUT   History of kidney stones    No issues at this time. Will check urine. Call with any concerns.       Relevant Orders   CBC with Differential OUT   Comp Met (CMET)   UA/M w/rflx Culture, Routine    Other Visit Diagnoses    Family history of diabetes mellitus       Will check labs. Await results. Call with any concerns.    Relevant Orders   Bayer DCA Hb A1c Waived   CBC with Differential OUT   Comp Met (CMET)       Follow up plan: Return  in about 4 weeks (around 08/18/2019) for Physical.   . This visit was completed via [employer] due to the restrictions of the COVID-19 pandemic. All issues as above were discussed and addressed. Physical exam was done as above through visual confirmation on D[employer]. If it was felt that the patient should be evaluated in the office, they were directed there. The patient verbally consented to this visit. . Location of the patient: work . Location of the provider: home . Those involved with this call:  . Provider: Park Liter, DO . CMA: Tiffany Reel, CMA . Front Desk/Registration: Don Perking  . Time spent on call: 30 minutes with patient face to face via video conference. More than 50% of this time was spent in counseling and coordination of care. 45 minutes total spent in review of patient's record and preparation of their chart.

## 2019-07-21 NOTE — Assessment & Plan Note (Signed)
Will recheck labs and treat as needed. Call with any concerns. Continue to monitor.

## 2019-08-02 DIAGNOSIS — Z20828 Contact with and (suspected) exposure to other viral communicable diseases: Secondary | ICD-10-CM | POA: Diagnosis not present

## 2019-08-17 ENCOUNTER — Encounter: Payer: Self-pay | Admitting: Family Medicine

## 2019-08-23 DIAGNOSIS — Z20828 Contact with and (suspected) exposure to other viral communicable diseases: Secondary | ICD-10-CM | POA: Diagnosis not present

## 2019-10-28 DIAGNOSIS — Z23 Encounter for immunization: Secondary | ICD-10-CM | POA: Diagnosis not present

## 2019-10-30 ENCOUNTER — Other Ambulatory Visit: Payer: Self-pay | Admitting: Family Medicine

## 2020-01-06 ENCOUNTER — Ambulatory Visit (INDEPENDENT_AMBULATORY_CARE_PROVIDER_SITE_OTHER): Payer: BC Managed Care – PPO | Admitting: Family Medicine

## 2020-01-06 ENCOUNTER — Other Ambulatory Visit: Payer: Self-pay

## 2020-01-06 ENCOUNTER — Encounter: Payer: Self-pay | Admitting: Family Medicine

## 2020-01-06 VITALS — BP 137/87 | HR 68 | Temp 97.5°F | Ht 70.67 in | Wt 228.0 lb

## 2020-01-06 DIAGNOSIS — E782 Mixed hyperlipidemia: Secondary | ICD-10-CM

## 2020-01-06 DIAGNOSIS — I10 Essential (primary) hypertension: Secondary | ICD-10-CM | POA: Diagnosis not present

## 2020-01-06 DIAGNOSIS — Z0000 Encounter for general adult medical examination without abnormal findings: Secondary | ICD-10-CM

## 2020-01-06 LAB — UA/M W/RFLX CULTURE, ROUTINE
Bilirubin, UA: NEGATIVE
Glucose, UA: NEGATIVE
Ketones, UA: NEGATIVE
Leukocytes,UA: NEGATIVE
Nitrite, UA: NEGATIVE
Protein,UA: NEGATIVE
RBC, UA: NEGATIVE
Specific Gravity, UA: 1.02 (ref 1.005–1.030)
Urobilinogen, Ur: 0.2 mg/dL (ref 0.2–1.0)
pH, UA: 5.5 (ref 5.0–7.5)

## 2020-01-06 LAB — MICROALBUMIN, URINE WAIVED
Creatinine, Urine Waived: 100 mg/dL (ref 10–300)
Microalb, Ur Waived: 10 mg/L (ref 0–19)
Microalb/Creat Ratio: <30 mg/g (ref <30)

## 2020-01-06 MED ORDER — LISINOPRIL 10 MG PO TABS: 10.0000 mg | ORAL_TABLET | Freq: Every day | ORAL | 1 refills | Status: DC

## 2020-01-06 NOTE — Assessment & Plan Note (Signed)
Doing OK today. Encouraged him to take his medicine daily. Checking labs today. Await results. Call with any concerns.

## 2020-01-06 NOTE — Patient Instructions (Signed)

## 2020-01-06 NOTE — Addendum Note (Signed)
Addended by: Valerie Roys on: 01/06/2020 08:50 AM   Modules accepted: Orders

## 2020-01-06 NOTE — Assessment & Plan Note (Signed)
Rechecking levels today. Await results. Call with any concerns. Treat as needed.  

## 2020-01-06 NOTE — Progress Notes (Signed)
BP 137/87 (BP Location: Left Arm, Patient Position: Sitting, Cuff Size: Normal)   Pulse 68   Temp (!) 97.5 F (36.4 C) (Oral)   Ht 5' 10.67" (1.795 m)   Wt 228 lb (103.4 kg)   SpO2 100%   BMI 32.10 kg/m    Subjective:    Patient ID: Matthew Hartman, male    DOB: [birth year], 44 y.o.   MRN: [id number]  HPI: Matthew Hartman is a 44 y.o. male presenting on 01/06/2020 for comprehensive medical examination. Current medical complaints include:  HYPERTENSION- has only been taking his medication when he was stressed. He did take his medicine today. Has been doing crossfit every day  Hypertension status: better  Satisfied with current treatment? yes Duration of hypertension: chronic BP monitoring frequency:  a few times a week BP range:  BP medication side effects:  no Medication compliance: poor compliance Previous BP meds: lisinopril Aspirin: no Recurrent headaches: no Visual changes: no Palpitations: no Dyspnea: no Chest pain: no Lower extremity edema: no Dizzy/lightheaded: no  Interim Problems from his last visit: no  Depression Screen done today and results listed below:  Depression screen [facility] 2/9 01/06/2020 07/21/2019  Decreased Interest 0 0  Down, Depressed, Hopeless 0 0  PHQ - 2 Score 0 0  Altered sleeping 0 -  Tired, decreased energy 0 -  Change in appetite 0 -  Feeling bad or failure about yourself  0 -  Trouble concentrating 0 -  Moving slowly or fidgety/restless 0 -  Suicidal thoughts 0 -  PHQ-9 Score 0 -  Difficult doing work/chores Not difficult at all -    Past Medical History:  Past Medical History:  Diagnosis Date  . Hypertension     Surgical History:  Past Surgical History:  Procedure Laterality Date  . FRACTURE SURGERY Right    Has pins  . KNEE SURGERY      Medications:  Current Outpatient Medications on File Prior to Visit  Medication Sig  . lisinopril (ZESTRIL) 10 MG tablet TAKE 1 TABLET BY MOUTH EVERY DAY   No current  facility-administered medications on file prior to visit.    Allergies:  Allergies  Allergen Reactions  . Ibuprofen Itching and Swelling    Social History:  Social History   Socioeconomic History  . Marital status: Married    Spouse name: Not on file  . Number of children: Not on file  . Years of education: Not on file  . Highest education level: Not on file  Occupational History  . Not on file  Tobacco Use  . Smoking status: Never Smoker  . Smokeless tobacco: Never Used  Substance and Sexual Activity  . Alcohol use: No  . Drug use: No  . Sexual activity: Yes    Birth control/protection: Surgical  Other Topics Concern  . Not on file  Social History Narrative  . Not on file   Social Determinants of Health   Financial Resource Strain:   . Difficulty of Paying Living Expenses:   Food Insecurity:   . Worried About Charity fundraiser in the Last Year:   . Arboriculturist in the Last Year:   Transportation Needs:   . Film/video editor (Medical):   Marland Kitchen Lack of Transportation (Non-Medical):   Physical Activity:   . Days of Exercise per Week:   . Minutes of Exercise per Session:   Stress:   . Feeling of Stress :   Social Connections:   .  Frequency of Communication with Friends and Family:   . Frequency of Social Gatherings with Friends and Family:   . Attends Religious Services:   . Active Member of Clubs or Organizations:   . Attends Archivist Meetings:   Marland Kitchen Marital Status:   Intimate Partner Violence:   . Fear of Current or Ex-Partner:   . Emotionally Abused:   Marland Kitchen Physically Abused:   . Sexually Abused:    Social History   Tobacco Use  Smoking Status Never Smoker  Smokeless Tobacco Never Used   Social History   Substance and Sexual Activity  Alcohol Use No    Family History:  Family History  Problem Relation Age of Onset  . Hypertension Mother   . Hyperlipidemia Mother   . Hypertension Father   . Heart attack Father   .  Hyperlipidemia Father   . Diabetes Sister   . Heart attack Maternal Grandfather   . Diabetes Maternal Grandfather   . COPD Paternal Grandmother   . Cancer Paternal Grandfather        Stomach  . Down syndrome Son     Past medical history, surgical history, medications, allergies, family history and social history reviewed with patient today and changes made to appropriate areas of the chart.   Review of Systems  Constitutional: Negative.   HENT: Negative.   Eyes: Negative.        Starting to have trouble seeing at night- having some issues, seeing eye doctor   Respiratory: Negative.   Cardiovascular: Negative.   Gastrointestinal: Negative.        Occasional proctus fugix  Genitourinary: Negative.   Musculoskeletal: Positive for joint pain and myalgias. Negative for back pain, falls and neck pain.  Skin: Negative.   Neurological: Negative.   Endo/Heme/Allergies: Negative.   Psychiatric/Behavioral: Negative.     All other ROS negative except what is listed above and in the HPI.      Objective:    BP 137/87 (BP Location: Left Arm, Patient Position: Sitting, Cuff Size: Normal)   Pulse 68   Temp (!) 97.5 F (36.4 C) (Oral)   Ht 5' 10.67" (1.795 m)   Wt 228 lb (103.4 kg)   SpO2 100%   BMI 32.10 kg/m   Wt Readings from Last 3 Encounters:  01/06/20 228 lb (103.4 kg)  07/21/19 225 lb (102.1 kg)  06/24/19 220 lb (99.8 kg)    Physical Exam  Results for orders placed or performed during the hospital encounter of 06/24/19  CBC with Differential  Result Value Ref Range   WBC 7.3 4.0 - 10.5 K/uL   RBC 5.22 4.22 - 5.81 MIL/uL   Hemoglobin 15.8 13.0 - 17.0 g/dL   HCT 46.6 39.0 - 52.0 %   MCV 89.3 80.0 - 100.0 fL   MCH 30.3 26.0 - 34.0 pg   MCHC 33.9 30.0 - 36.0 g/dL   RDW 12.2 11.5 - 15.5 %   Platelets 232 150 - 400 K/uL   nRBC 0.0 0.0 - 0.2 %   Neutrophils Relative % 51 %   Neutro Abs 3.7 1.7 - 7.7 K/uL   Lymphocytes Relative 39 %   Lymphs Abs 2.9 0.7 - 4.0 K/uL    Monocytes Relative 8 %   Monocytes Absolute 0.6 0.1 - 1.0 K/uL   Eosinophils Relative 1 %   Eosinophils Absolute 0.1 0.0 - 0.5 K/uL   Basophils Relative 1 %   Basophils Absolute 0.0 0.0 - 0.1 K/uL   Immature Granulocytes  0 %   Abs Immature Granulocytes 0.01 0.00 - 0.07 K/uL  Comprehensive metabolic panel  Result Value Ref Range   Sodium 139 135 - 145 mmol/L   Potassium 4.4 3.5 - 5.1 mmol/L   Chloride 105 98 - 111 mmol/L   CO2 26 22 - 32 mmol/L   Glucose, Bld 118 (H) 70 - 99 mg/dL   BUN 20 6 - 20 mg/dL   Creatinine, Ser 1.23 0.61 - 1.24 mg/dL   Calcium 9.2 8.9 - 10.3 mg/dL   Total Protein 7.6 6.5 - 8.1 g/dL   Albumin 4.6 3.5 - 5.0 g/dL   AST 25 15 - 41 U/L   ALT 26 0 - 44 U/L   Alkaline Phosphatase 57 38 - 126 U/L   Total Bilirubin 0.6 0.3 - 1.2 mg/dL   GFR calc non Af Amer >60 >60 mL/min   GFR calc Af Amer >60 >60 mL/min   Anion gap 8 5 - 15  Lipase, blood  Result Value Ref Range   Lipase 29 11 - 51 U/L      Assessment & Plan:   Problem List Items Addressed This Visit      Cardiovascular and Mediastinum   HTN (hypertension)    Doing OK today. Encouraged him to take his medicine daily. Checking labs today. Await results. Call with any concerns.       Relevant Orders   Comprehensive metabolic panel   Microalbumin, Urine Waived     Other   HLD (hyperlipidemia)    Rechecking levels today. Await results. Call with any concerns. Treat as needed.       Relevant Orders   Comprehensive metabolic panel   Lipid Panel w/o Chol/HDL Ratio    Other Visit Diagnoses    Routine general medical examination at a health care facility    -  Primary   Vaccines up to date/declined. Screening labs checked today. Continue diet and exercise. Call with any concerns. Continue to monitor.    Relevant Orders   CBC with Differential/Platelet   Comprehensive metabolic panel   Lipid Panel w/o Chol/HDL Ratio   Microalbumin, Urine Waived   TSH   UA/M w/rflx Culture, Routine        LABORATORY TESTING:  Health maintenance labs ordered today as discussed above.   IMMUNIZATIONS:   - Tdap: Tetanus vaccination status reviewed: last tetanus booster within 10 years. - Influenza: Up to date - Pneumovax: Not applicable  PATIENT COUNSELING:    Sexuality: Discussed sexually transmitted diseases, partner selection, use of condoms, avoidance of unintended pregnancy  and contraceptive alternatives.   Advised to avoid cigarette smoking.  I discussed with the patient that most people either abstain from alcohol or drink within safe limits (<=14/week and <=4 drinks/occasion for males, <=7/weeks and <= 3 drinks/occasion for females) and that the risk for alcohol disorders and other health effects rises proportionally with the number of drinks per week and how often a drinker exceeds daily limits.  Discussed cessation/primary prevention of drug use and availability of treatment for abuse.   Diet: Encouraged to adjust caloric intake to maintain  or achieve ideal body weight, to reduce intake of dietary saturated fat and total fat, to limit sodium intake by avoiding high sodium foods and not adding table salt, and to maintain adequate dietary potassium and calcium preferably from fresh fruits, vegetables, and low-fat dairy products.    stressed the importance of regular exercise  Injury prevention: Discussed safety belts, safety helmets, smoke detector, smoking near  bedding or upholstery.   Dental health: Discussed importance of regular tooth brushing, flossing, and dental visits.   Follow up plan: NEXT PREVENTATIVE PHYSICAL DUE IN 1 YEAR. Return in about 6 months (around 07/07/2020).

## 2020-01-07 LAB — COMPREHENSIVE METABOLIC PANEL
ALT: 25 IU/L (ref 0–44)
AST: 27 IU/L (ref 0–40)
Albumin/Globulin Ratio: 2.1 (ref 1.2–2.2)
Albumin: 4.5 g/dL (ref 4.0–5.0)
Alkaline Phosphatase: 62 IU/L (ref 39–117)
BUN/Creatinine Ratio: 19 (ref 9–20)
BUN: 22 mg/dL (ref 6–24)
Bilirubin Total: 0.3 mg/dL (ref 0.0–1.2)
CO2: 22 mmol/L (ref 20–29)
Calcium: 9.1 mg/dL (ref 8.7–10.2)
Chloride: 105 mmol/L (ref 96–106)
Creatinine, Ser: 1.15 mg/dL (ref 0.76–1.27)
GFR calc Af Amer: 90 mL/min/{1.73_m2} (ref >59)
GFR calc non Af Amer: 78 mL/min/{1.73_m2} (ref >59)
Globulin, Total: 2.1 g/dL (ref 1.5–4.5)
Glucose: 79 mg/dL (ref 65–99)
Potassium: 4.6 mmol/L (ref 3.5–5.2)
Sodium: 140 mmol/L (ref 134–144)
Total Protein: 6.6 g/dL (ref 6.0–8.5)

## 2020-01-07 LAB — CBC WITH DIFFERENTIAL/PLATELET
Basophils Absolute: 0 10*3/uL (ref 0.0–0.2)
Basos: 0 %
EOS (ABSOLUTE): 0.1 10*3/uL (ref 0.0–0.4)
Eos: 1 %
Hematocrit: 45.4 % (ref 37.5–51.0)
Hemoglobin: 14.9 g/dL (ref 13.0–17.7)
Immature Grans (Abs): 0 10*3/uL (ref 0.0–0.1)
Immature Granulocytes: 0 %
Lymphocytes Absolute: 1.7 10*3/uL (ref 0.7–3.1)
Lymphs: 17 %
MCH: 30.9 pg (ref 26.6–33.0)
MCHC: 32.8 g/dL (ref 31.5–35.7)
MCV: 94 fL (ref 79–97)
Monocytes Absolute: 0.7 10*3/uL (ref 0.1–0.9)
Monocytes: 7 %
Neutrophils Absolute: 7.5 10*3/uL — ABNORMAL HIGH (ref 1.4–7.0)
Neutrophils: 75 %
Platelets: 206 10*3/uL (ref 150–450)
RBC: 4.82 x10E6/uL (ref 4.14–5.80)
RDW: 12.7 % (ref 11.6–15.4)
WBC: 9.9 10*3/uL (ref 3.4–10.8)

## 2020-01-07 LAB — LIPID PANEL W/O CHOL/HDL RATIO
Cholesterol, Total: 219 mg/dL — ABNORMAL HIGH (ref 100–199)
HDL: 49 mg/dL (ref >39)
LDL Chol Calc (NIH): 134 mg/dL — ABNORMAL HIGH (ref 0–99)
Triglycerides: 200 mg/dL — ABNORMAL HIGH (ref 0–149)
VLDL Cholesterol Cal: 36 mg/dL (ref 5–40)

## 2020-01-07 LAB — TSH: TSH: 4.17 u[IU]/mL (ref 0.450–4.500)

## 2020-01-09 ENCOUNTER — Encounter: Payer: Self-pay | Admitting: Family Medicine

## 2020-01-10 DIAGNOSIS — M9901 Segmental and somatic dysfunction of cervical region: Secondary | ICD-10-CM | POA: Diagnosis not present

## 2020-01-10 DIAGNOSIS — M5413 Radiculopathy, cervicothoracic region: Secondary | ICD-10-CM | POA: Diagnosis not present

## 2020-01-11 ENCOUNTER — Telehealth: Payer: Self-pay | Admitting: Family Medicine

## 2020-01-11 DIAGNOSIS — M5412 Radiculopathy, cervical region: Secondary | ICD-10-CM | POA: Diagnosis not present

## 2020-01-11 DIAGNOSIS — M9901 Segmental and somatic dysfunction of cervical region: Secondary | ICD-10-CM | POA: Diagnosis not present

## 2020-01-11 DIAGNOSIS — M5413 Radiculopathy, cervicothoracic region: Secondary | ICD-10-CM | POA: Diagnosis not present

## 2020-01-11 NOTE — Telephone Encounter (Signed)
error 

## 2020-01-13 DIAGNOSIS — M5412 Radiculopathy, cervical region: Secondary | ICD-10-CM | POA: Diagnosis not present

## 2020-01-23 DIAGNOSIS — M5412 Radiculopathy, cervical region: Secondary | ICD-10-CM | POA: Diagnosis not present

## 2020-01-27 DIAGNOSIS — M5412 Radiculopathy, cervical region: Secondary | ICD-10-CM | POA: Diagnosis not present

## 2020-01-28 IMAGING — CT CT RENAL STONE PROTOCOL
2 of 4 series · 16 of 46 positions shown, 18 images · non-contrast
Comparison: No priors.

CLINICAL DATA: 43-year-old male with history of acute onset of
right-sided flank pain radiating into the right lower quadrant.

EXAM:
CT ABDOMEN AND PELVIS WITHOUT CONTRAST
TECHNIQUE: Multidetector CT imaging of the abdomen and pelvis was performed
following the standard protocol without IV contrast.

[Series 2: stone full standard · axial · 0.80mm/px · z∈[-558,-83]mm · 13 of 105 slices shown, 15 images]
[im 5/105  soft-tissue]
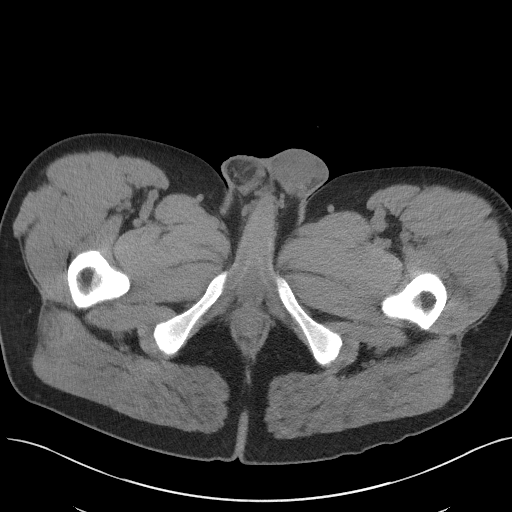
[im 5/105  bone]
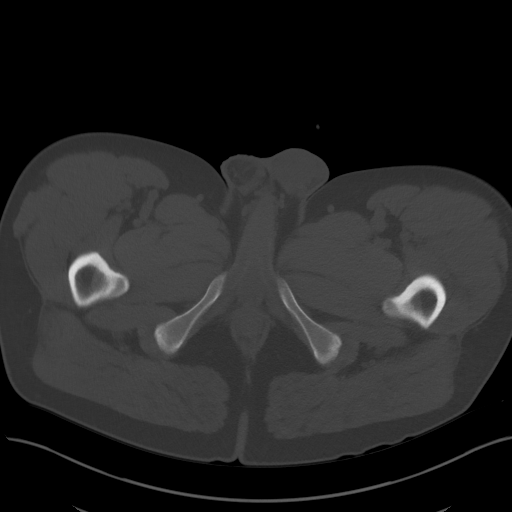
[im 14/105  soft-tissue]
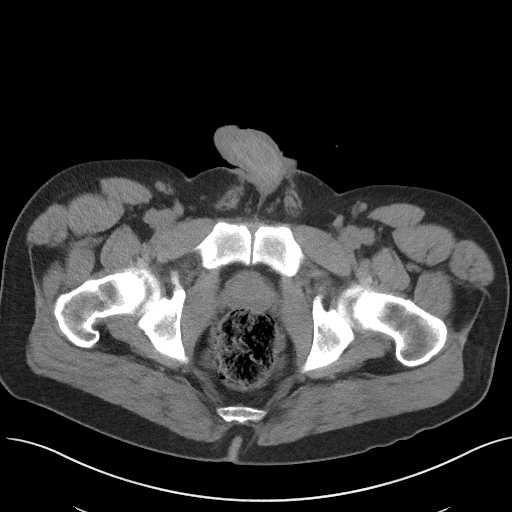
[im 22/105  soft-tissue]
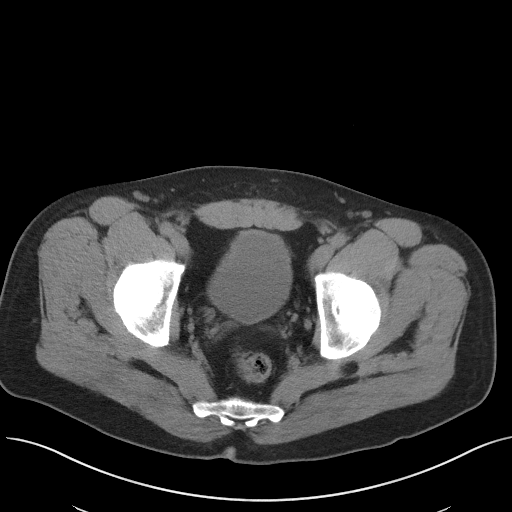
[im 31/105  soft-tissue]
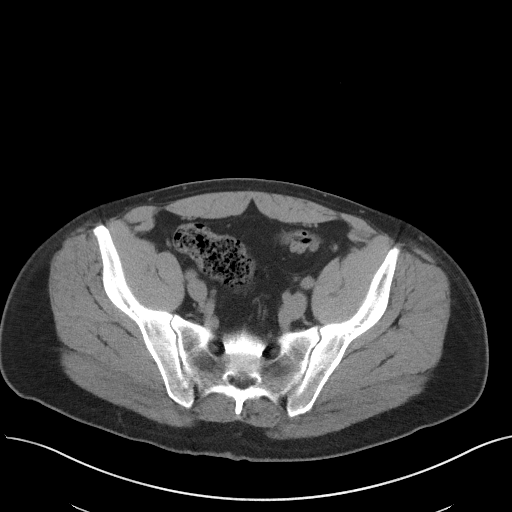
[im 35/105  soft-tissue]
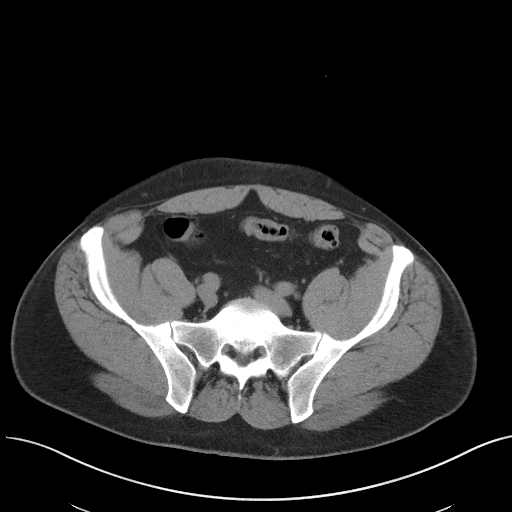
[im 44/105  soft-tissue]
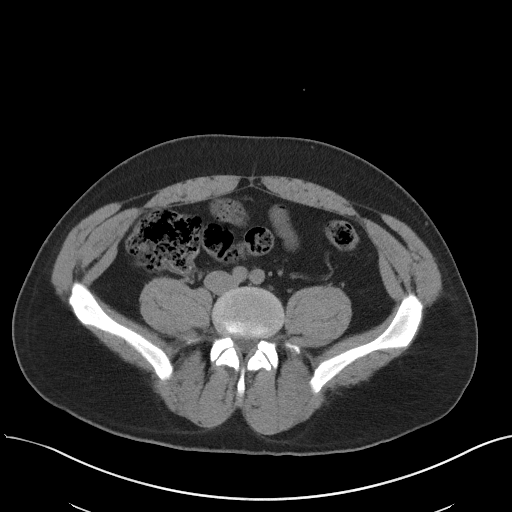
[im 53/105  soft-tissue]
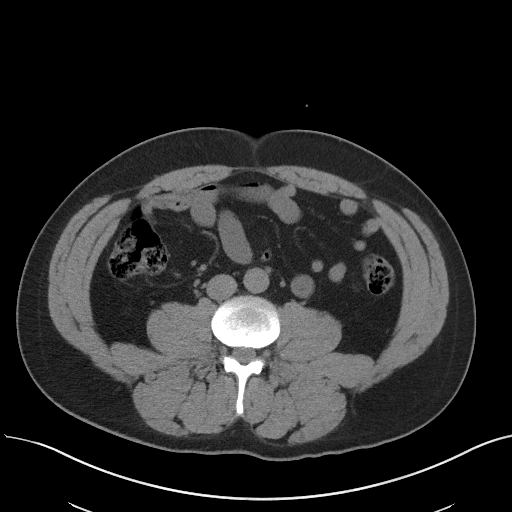
[im 61/105  soft-tissue]
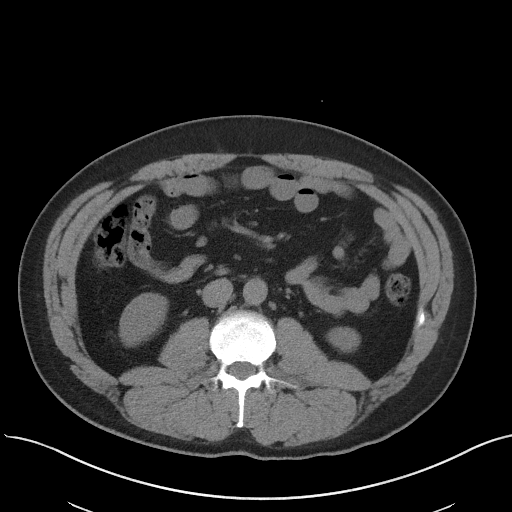
[im 70/105  soft-tissue]
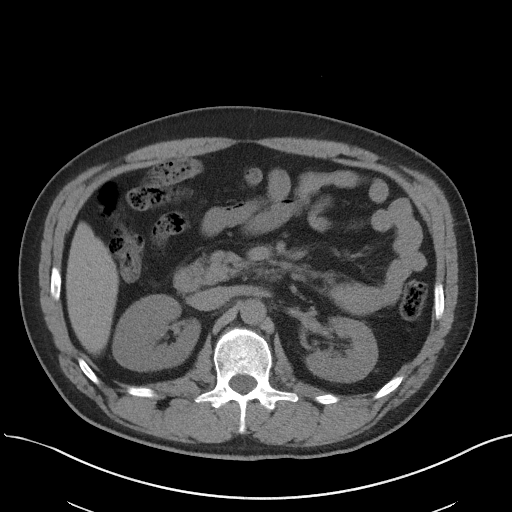
[im 70/105  bone]
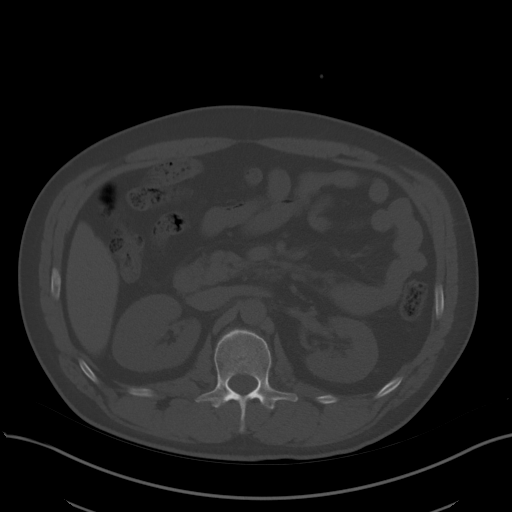
[im 74/105  soft-tissue]
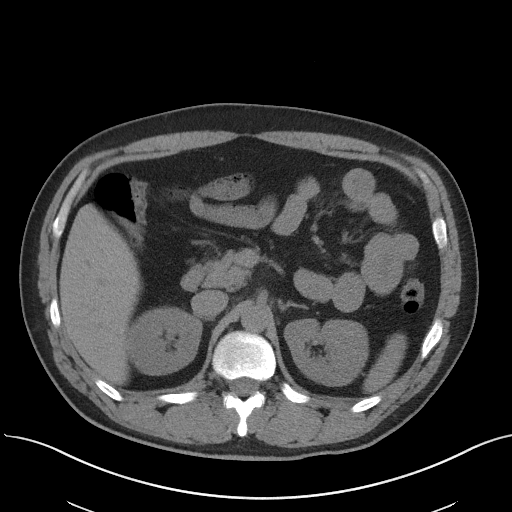
[im 83/105  soft-tissue]
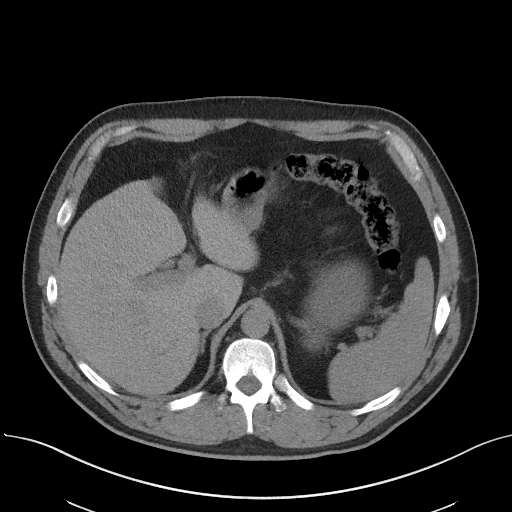
[im 92/105  soft-tissue]
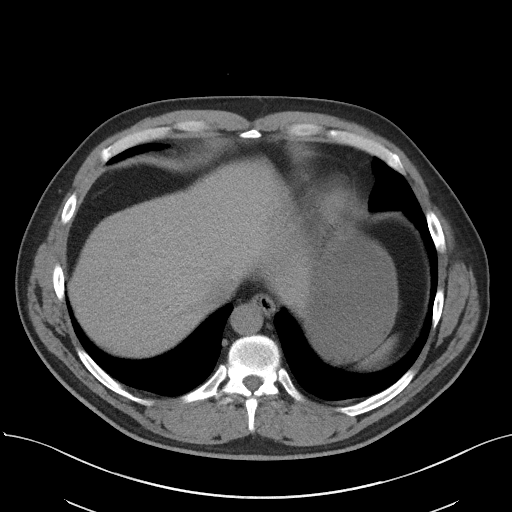
[im 100/105  soft-tissue]
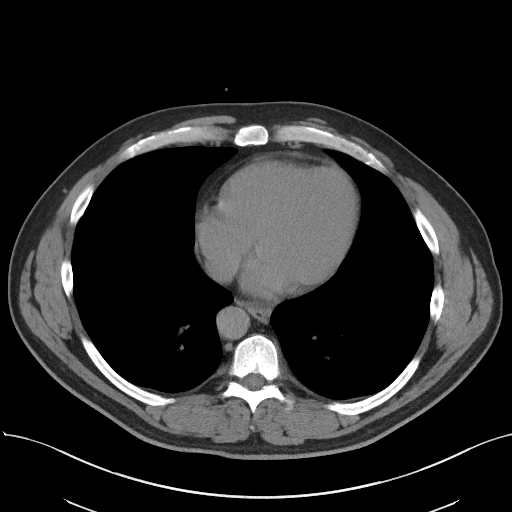

[Series 5: coronal · coronal · 0.81mm/px · 3 of 143 slices shown]
[im 48/143  soft-tissue]
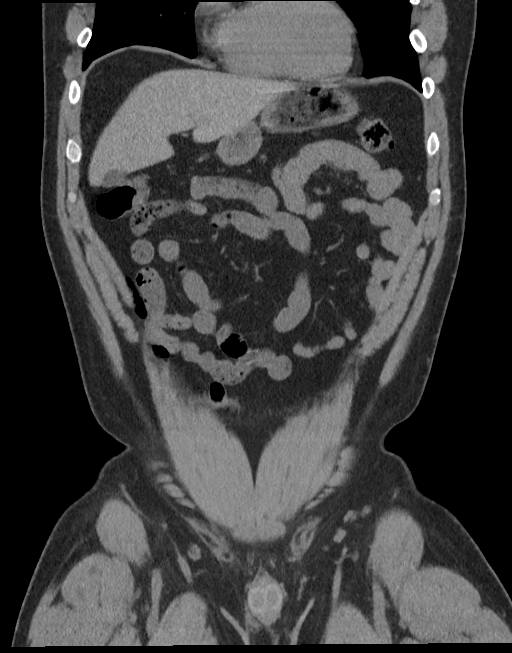
[im 64/143  soft-tissue]
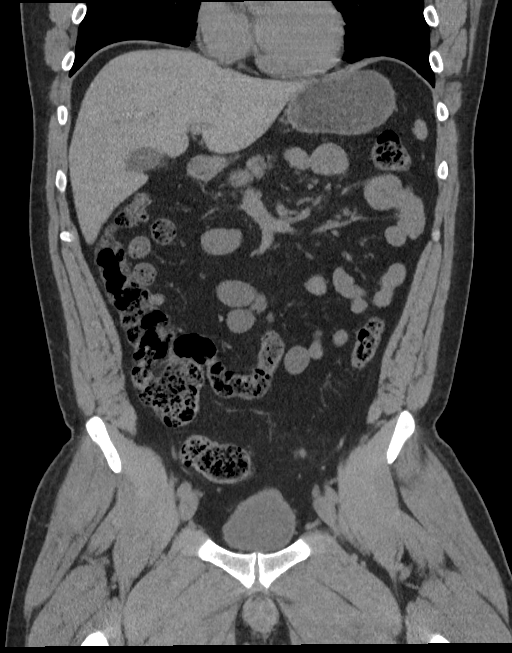
[im 79/143  soft-tissue]
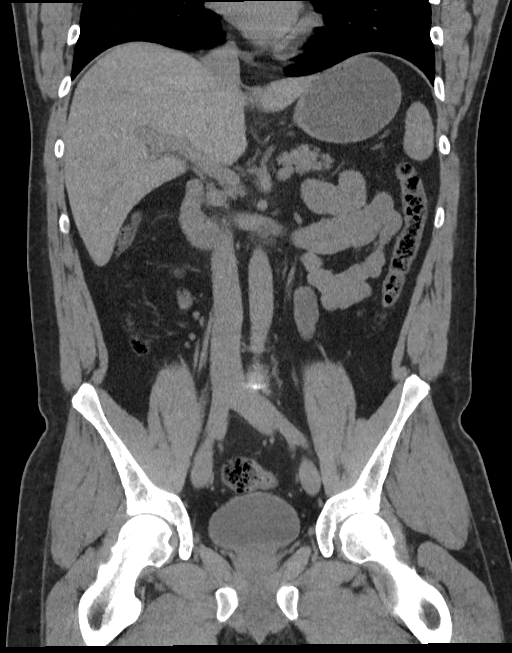

[16 of 46 positions shown; findings below may reference images not displayed]

FINDINGS: Lower chest: Unremarkable.

Hepatobiliary: No definite cystic or solid hepatic lesions are
confidently identified on today's noncontrast CT examination.
Unenhanced appearance of the gallbladder is normal.

Pancreas: No definite pancreatic mass or peripancreatic fluid
collections or inflammatory changes noted on today's noncontrast CT
examination.

Spleen: Unremarkable.

Adrenals/Urinary Tract: 3 mm nonobstructive calculus in the
interpolar collecting system of the right kidney. No additional
calculi are noted within the collecting system of the left kidney or
along the course of either ureter. 3 mm calculus lying dependently
in the left side of the urinary bladder. Urinary bladder is
otherwise unremarkable in appearance. Unenhanced appearance of the
kidneys and bilateral adrenal glands is otherwise unremarkable.

Stomach/Bowel: Unenhanced appearance of the stomach is normal. No
pathologic dilatation of small bowel or colon. Normal appendix.

Vascular/Lymphatic: No atherosclerotic calcifications in the
abdominal aorta or pelvic vasculature. No lymphadenopathy noted in
the abdomen or pelvis.

Reproductive: Prostate gland and seminal vesicles are unremarkable
in appearance.

Other: No significant volume of ascites.  No pneumoperitoneum.

Musculoskeletal: There are no aggressive appearing lytic or blastic
lesions noted in the visualized portions of the skeleton.
IMPRESSION: 1. 3 mm nonobstructive calculus in the interpolar collecting system
of the right kidney.
2. 3 mm calculus lying dependently in the urinary bladder.
3. No ureteral stones or findings of urinary tract obstruction are
noted at this time.

## 2020-01-31 DIAGNOSIS — M5020 Other cervical disc displacement, unspecified cervical region: Secondary | ICD-10-CM | POA: Diagnosis not present

## 2020-01-31 DIAGNOSIS — M5412 Radiculopathy, cervical region: Secondary | ICD-10-CM | POA: Diagnosis not present

## 2020-02-08 DIAGNOSIS — Z01818 Encounter for other preprocedural examination: Secondary | ICD-10-CM | POA: Diagnosis not present

## 2020-02-08 DIAGNOSIS — M5412 Radiculopathy, cervical region: Secondary | ICD-10-CM | POA: Diagnosis not present

## 2020-02-13 DIAGNOSIS — M5412 Radiculopathy, cervical region: Secondary | ICD-10-CM | POA: Diagnosis not present

## 2020-02-13 DIAGNOSIS — Z7952 Long term (current) use of systemic steroids: Secondary | ICD-10-CM | POA: Diagnosis not present

## 2020-02-13 DIAGNOSIS — I10 Essential (primary) hypertension: Secondary | ICD-10-CM | POA: Diagnosis not present

## 2020-02-13 DIAGNOSIS — Z886 Allergy status to analgesic agent status: Secondary | ICD-10-CM | POA: Diagnosis not present

## 2020-02-13 DIAGNOSIS — Z87442 Personal history of urinary calculi: Secondary | ICD-10-CM | POA: Diagnosis not present

## 2020-02-13 DIAGNOSIS — M50223 Other cervical disc displacement at C6-C7 level: Secondary | ICD-10-CM | POA: Diagnosis not present

## 2020-02-14 DIAGNOSIS — M50223 Other cervical disc displacement at C6-C7 level: Secondary | ICD-10-CM | POA: Diagnosis not present

## 2020-02-14 DIAGNOSIS — Z886 Allergy status to analgesic agent status: Secondary | ICD-10-CM | POA: Diagnosis not present

## 2020-02-14 DIAGNOSIS — Z7952 Long term (current) use of systemic steroids: Secondary | ICD-10-CM | POA: Diagnosis not present

## 2020-02-14 DIAGNOSIS — M5412 Radiculopathy, cervical region: Secondary | ICD-10-CM | POA: Diagnosis not present

## 2020-02-14 DIAGNOSIS — Z87442 Personal history of urinary calculi: Secondary | ICD-10-CM | POA: Diagnosis not present

## 2020-02-14 DIAGNOSIS — I10 Essential (primary) hypertension: Secondary | ICD-10-CM | POA: Diagnosis not present

## 2020-03-06 DIAGNOSIS — M50223 Other cervical disc displacement at C6-C7 level: Secondary | ICD-10-CM | POA: Diagnosis not present

## 2020-03-06 DIAGNOSIS — Z4789 Encounter for other orthopedic aftercare: Secondary | ICD-10-CM | POA: Diagnosis not present

## 2020-03-06 DIAGNOSIS — Z9889 Other specified postprocedural states: Secondary | ICD-10-CM | POA: Diagnosis not present

## 2020-03-13 DIAGNOSIS — M542 Cervicalgia: Secondary | ICD-10-CM | POA: Diagnosis not present

## 2020-03-20 DIAGNOSIS — M542 Cervicalgia: Secondary | ICD-10-CM | POA: Diagnosis not present

## 2020-03-28 DIAGNOSIS — Z4889 Encounter for other specified surgical aftercare: Secondary | ICD-10-CM | POA: Diagnosis not present

## 2021-10-11 ENCOUNTER — Ambulatory Visit: Admission: EM | Admit: 2021-10-11 | Payer: BC Managed Care – PPO | Source: Home / Self Care

## 2022-02-11 ENCOUNTER — Ambulatory Visit: Payer: Self-pay

## 2022-02-11 NOTE — Telephone Encounter (Signed)
LVM asking patient to call back to schedule an appt 

## 2022-02-11 NOTE — Telephone Encounter (Signed)
Please call and schedule the patient an appointment to come in and be evaluated.

## 2022-02-11 NOTE — Telephone Encounter (Signed)
  Chief Complaint: Left side abdominal pain shooting to back Symptoms: ibid Frequency: since yesterday Pertinent Negatives: Patient denies  Disposition: '[x]'$ ED /'[]'$ Urgent Care (no appt availability in office) / '[]'$ Appointment(In office/virtual)/ '[]'$  [facility] [facility]/ '[]'$ Home Care/ '[]'$ Refused Recommended Disposition /'[]'$ [place] [facility]/ '[]'$  Follow-up with PCP Additional Notes: Pt has a history of kidney stones. When pt had last kidney stone causing pain on right side a kidney stone was noted on the left side. Pt thinks that this is a kidney stone as the pain is similar. Pain goes from 0.5 to 10/10.   Reason for Disposition  [1] SEVERE pain (e.g., excruciating) AND [2] present > 1 hour  Answer Assessment - Initial Assessment Questions 1. LOCATION: "Where does it hurt?"      left 2. RADIATION: "Does the pain shoot anywhere else?" (e.g., chest, back)     Around to back 3. ONSET: "When did the pain begin?" (Minutes, hours or days ago)      yesterday 4. SUDDEN: "Gradual or sudden onset?"     sudden 5. PATTERN "Does the pain come and go, or is it constant?"    - If constant: "Is it getting better, staying the same, or worsening?"      (Note: Constant means the pain never goes away completely; most serious pain is constant and it progresses)     - If intermittent: "How long does it last?" "Do you have pain now?"     (Note: Intermittent means the pain goes away completely between bouts)     intermittent 6. SEVERITY: "How bad is the pain?"  (e.g., Scale 1-10; mild, moderate, or severe)    - MILD (1-3): doesn't interfere with normal activities, abdomen soft and not tender to touch     - MODERATE (4-7): interferes with normal activities or awakens from sleep, abdomen tender to touch     - SEVERE (8-10): excruciating pain, doubled over, unable to do any normal activities       Pain goes from 0.5 to 10/10 7. RECURRENT SYMPTOM: "Have you ever had this type of stomach pain before?" If Yes, ask:  "When was the last time?" and "What happened that time?"      yes 8. CAUSE: "What do you think is causing the stomach pain?"     Kidney stone 9. RELIEVING/AGGRAVATING FACTORS: "What makes it better or worse?" (e.g., movement, antacids, bowel movement)      10. OTHER SYMPTOMS: "Do you have any other symptoms?" (e.g., back pain, diarrhea, fever, urination pain, vomiting)  Protocols used: Abdominal Pain - Male-A-AH

## 2022-02-14 ENCOUNTER — Other Ambulatory Visit: Payer: Self-pay

## 2022-02-14 ENCOUNTER — Emergency Department
Admission: EM | Admit: 2022-02-14 | Discharge: 2022-02-14 | Disposition: A | Discharge status: Home / Self Care | Payer: BC Managed Care – PPO | Attending: Emergency Medicine | Admitting: Emergency Medicine

## 2022-02-14 ENCOUNTER — Emergency Department: Payer: BC Managed Care – PPO

## 2022-02-14 DIAGNOSIS — N200 Calculus of kidney: Secondary | ICD-10-CM | POA: Insufficient documentation

## 2022-02-14 DIAGNOSIS — R109 Unspecified abdominal pain: Secondary | ICD-10-CM | POA: Diagnosis not present

## 2022-02-14 LAB — CBC
HCT: 51.5 % (ref 39.0–52.0)
Hemoglobin: 17.3 g/dL — ABNORMAL HIGH (ref 13.0–17.0)
MCH: 30.6 pg (ref 26.0–34.0)
MCHC: 33.6 g/dL (ref 30.0–36.0)
MCV: 91.2 fL (ref 80.0–100.0)
Platelets: 231 10*3/uL (ref 150–400)
RBC: 5.65 MIL/uL (ref 4.22–5.81)
RDW: 12.4 % (ref 11.5–15.5)
WBC: 8.9 10*3/uL (ref 4.0–10.5)
nRBC: 0 % (ref 0.0–0.2)

## 2022-02-14 LAB — BASIC METABOLIC PANEL
Anion gap: 4 — ABNORMAL LOW (ref 5–15)
BUN: 21 mg/dL — ABNORMAL HIGH (ref 6–20)
CO2: 28 mmol/L (ref 22–32)
Calcium: 9.2 mg/dL (ref 8.9–10.3)
Chloride: 106 mmol/L (ref 98–111)
Creatinine, Ser: 1.17 mg/dL (ref 0.61–1.24)
GFR, Estimated: >60 mL/min (ref >60)
Glucose, Bld: 93 mg/dL (ref 70–99)
Potassium: 4 mmol/L (ref 3.5–5.1)
Sodium: 138 mmol/L (ref 135–145)

## 2022-02-14 LAB — URINALYSIS, ROUTINE W REFLEX MICROSCOPIC
Bilirubin Urine: NEGATIVE
Glucose, UA: NEGATIVE mg/dL
Hgb urine dipstick: NEGATIVE
Ketones, ur: NEGATIVE mg/dL
Leukocytes,Ua: NEGATIVE
Nitrite: NEGATIVE
Protein, ur: NEGATIVE mg/dL
Specific Gravity, Urine: 1.021 (ref 1.005–1.030)
pH: 6 (ref 5.0–8.0)

## 2022-02-14 MED ORDER — OXYCODONE-ACETAMINOPHEN 5-325 MG PO TABS
2.0000 | ORAL_TABLET | Freq: Once | ORAL | Status: AC
  Administered 2022-02-14: 2 via ORAL
  Filled 2022-02-14: qty 2

## 2022-02-14 MED ORDER — HYDROCODONE-ACETAMINOPHEN 5-325 MG PO TABS: 1.0000 | ORAL_TABLET | Freq: Four times a day (QID) | ORAL | 0 refills | Status: DC | PRN

## 2022-02-14 MED ORDER — ONDANSETRON 4 MG PO TBDP
4.0000 mg | ORAL_TABLET | Freq: Once | ORAL | Status: AC
  Administered 2022-02-14: 4 mg via ORAL
  Filled 2022-02-14: qty 1

## 2022-02-14 MED ORDER — ONDANSETRON 4 MG PO TBDP: 4.0000 mg | ORAL_TABLET | Freq: Three times a day (TID) | ORAL | 0 refills | Status: DC | PRN

## 2022-02-14 MED ORDER — OXYCODONE HCL 5 MG PO TABS: 5.0000 mg | ORAL_TABLET | Freq: Four times a day (QID) | ORAL | 0 refills | Status: DC | PRN

## 2022-02-14 MED ORDER — TAMSULOSIN HCL 0.4 MG PO CAPS: 0.4000 mg | ORAL_CAPSULE | Freq: Every day | ORAL | 0 refills | Status: AC

## 2022-02-14 NOTE — ED Provider Notes (Signed)
[facility] Provider Note    Event Date/Time   First MD Initiated Contact with Patient 02/14/22 1622     (approximate)   History   Flank Pain   HPI  Matthew Hartman is a 46 y.o. male here with left flank pain.  The patient states that his pain began fairly abruptly several days ago with left, aching, gnawing, flank pain.  He states that it then resolved.  He went and played soccer yesterday and felt the pain returned, and it has been fairly persistent since then.  He felt like it radiated down towards his left lower quadrant and towards his groin as well.  History of kidney stones, felt somewhat similar in terms of location although that one was much more severe.  No history of previous urological procedures.  Denies any dysuria or frequency.  No gross hematuria.  No other acute complaints.  No nausea or vomiting.  No fevers.     Physical Exam   Triage Vital Signs: ED Triage Vitals  Enc Vitals Group     BP 02/14/22 1450 (!) 150/98     Pulse Rate 02/14/22 1450 77     Resp 02/14/22 1450 18     Temp 02/14/22 1450 98.4 F (36.9 C)     Temp Source 02/14/22 1450 Oral     SpO2 02/14/22 1450 97 %     Weight 02/14/22 1451 220 lb (99.8 kg)     Height 02/14/22 1451 '5\' 11"'$  (1.803 m)     Head Circumference --      Peak Flow --      Pain Score 02/14/22 1451 3     Pain Loc --      Pain Edu? --      Excl. in [place]? --     Most recent vital signs: Vitals:   02/14/22 1450  BP: (!) 150/98  Pulse: 77  Resp: 18  Temp: 98.4 F (36.9 C)  SpO2: 97%     General: Awake, no distress.  CV:  Good peripheral perfusion.  Resp:  Normal effort.  Lungs clear to auscultation bilaterally. Abd:  No distention.  No tenderness.  No right lower quadrant or left lower quadrant tenderness.  No CVA tenderness bilaterally. Other:  Moist mucous membranes.   ED Results / Procedures / Treatments   Labs (all labs ordered are listed, but only abnormal results are  displayed) Labs Reviewed  URINALYSIS, ROUTINE W REFLEX MICROSCOPIC - Abnormal; Notable for the following components:      Result Value   Color, Urine YELLOW (*)    APPearance CLEAR (*)    All other components within normal limits  BASIC METABOLIC PANEL - Abnormal; Notable for the following components:   BUN 21 (*)    Anion gap 4 (*)    All other components within normal limits  CBC - Abnormal; Notable for the following components:   Hemoglobin 17.3 (*)    All other components within normal limits     EKG    RADIOLOGY CT stone: Punctate radiodensity at the level of the prostatic urethra likely suspicious for renal stone in transit, punctate right nephrolithiasis, no hydro-   I also independently reviewed and agree with radiologist interpretations.   PROCEDURES:  Critical Care performed: No     MEDICATIONS ORDERED IN ED: Medications  oxyCODONE-acetaminophen (PERCOCET/ROXICET) 5-325 MG per tablet 2 tablet (2 tablets Oral Given 02/14/22 1626)  ondansetron (ZOFRAN-ODT) disintegrating tablet 4 mg (4 mg Oral Given 02/14/22  1626)     IMPRESSION / MDM / ASSESSMENT AND PLAN / ED COURSE  I reviewed the triage vital signs and the nursing notes.                               The patient is on the cardiac monitor to evaluate for evidence of arrhythmia and/or significant heart rate changes.   Ddx:  Differential includes the following, with pertinent life- or limb-threatening emergencies considered:  Nephrolithiasis, musculoskeletal flank pain, thoracic or lumbar radiculopathy, zoster, colitis, diverticulitis, less likely UTI or pyelonephritis  Patient's presentation is most consistent with acute illness / injury with system symptoms.  MDM:  46 year old male here with left flank pain.  Clinically, suspect renal colic from nephrolithiasis, which seems to be improving.  CT stone study obtained, shows right-sided stones in the kidney, as well as a stone in the prostatic urethra  which I suspect is the etiology of his symptoms.  He does seem like his pain has improved since arriving in the ED.  I suspect he shortly passed this.  CBC shows no leukocytosis or anemia.  BMP is unremarkable with normal renal function.  Urinalysis negative for UTI or hematuria.  Differential also includes musculoskeletal flank pain.  Patient has reported allergy to ibuprofen but can take naproxen.  Will discharge with brief course of Flomax in the event of stone, as well as pain meds as needed.  I encouraged him to take naproxen twice a day at home for the next several days.  Return precautions given.  Encouraged hydration.  No other apparent acute emergent medical pathology.   MEDICATIONS GIVEN IN ED: Medications  oxyCODONE-acetaminophen (PERCOCET/ROXICET) 5-325 MG per tablet 2 tablet (2 tablets Oral Given 02/14/22 1626)  ondansetron (ZOFRAN-ODT) disintegrating tablet 4 mg (4 mg Oral Given 02/14/22 1626)     Consults:     EMR reviewed  Prior ED visits     FINAL CLINICAL IMPRESSION(S) / ED DIAGNOSES   Final diagnoses:  Flank pain  Kidney stone     Rx / DC Orders   ED Discharge Orders          Ordered    tamsulosin (FLOMAX) 0.4 MG CAPS capsule  Daily        02/14/22 1748    HYDROcodone-acetaminophen (NORCO/VICODIN) 5-325 MG tablet  Every 6 hours PRN,   Status:  Discontinued        02/14/22 1748    ondansetron (ZOFRAN-ODT) 4 MG disintegrating tablet  Every 8 hours PRN        02/14/22 1748    oxyCODONE (ROXICODONE) 5 MG immediate release tablet  Every 6 hours PRN        02/14/22 1838             Note:  This document was prepared using Dragon voice recognition software and may include unintentional dictation errors.   Duffy Bruce, MD 02/14/22 1840

## 2022-02-14 NOTE — ED Notes (Signed)
DC ppw provided to patient. Pt questions, followup and rx information reviewed. Pt decline vs at time of dc and sent to lobby alert and oriented at this time

## 2022-02-14 NOTE — ED Triage Notes (Signed)
Pt here with left flank pain. Pt states he has a hx of kidney stones. Pt has nausea but denies N/D.

## 2022-02-17 ENCOUNTER — Telehealth: Payer: Self-pay | Admitting: *Deleted

## 2022-02-17 NOTE — Telephone Encounter (Signed)
Transition Care Management Unsuccessful Follow-up Telephone Call  Date of discharge and from where:  [facility] 01-14-2022  Attempts:  1st Attempt  Reason for unsuccessful TCM follow-up call:  Left voice message

## 2022-06-11 DIAGNOSIS — M5412 Radiculopathy, cervical region: Secondary | ICD-10-CM | POA: Diagnosis not present

## 2022-06-11 DIAGNOSIS — M4712 Other spondylosis with myelopathy, cervical region: Secondary | ICD-10-CM | POA: Diagnosis not present

## 2022-06-11 DIAGNOSIS — M5020 Other cervical disc displacement, unspecified cervical region: Secondary | ICD-10-CM | POA: Diagnosis not present

## 2022-06-17 DIAGNOSIS — M5412 Radiculopathy, cervical region: Secondary | ICD-10-CM | POA: Diagnosis not present

## 2022-06-17 DIAGNOSIS — M5020 Other cervical disc displacement, unspecified cervical region: Secondary | ICD-10-CM | POA: Diagnosis not present

## 2022-06-25 DIAGNOSIS — M5020 Other cervical disc displacement, unspecified cervical region: Secondary | ICD-10-CM | POA: Diagnosis not present

## 2022-06-25 DIAGNOSIS — M5412 Radiculopathy, cervical region: Secondary | ICD-10-CM | POA: Diagnosis not present

## 2022-07-08 DIAGNOSIS — M5020 Other cervical disc displacement, unspecified cervical region: Secondary | ICD-10-CM | POA: Diagnosis not present

## 2022-07-08 DIAGNOSIS — M5412 Radiculopathy, cervical region: Secondary | ICD-10-CM | POA: Diagnosis not present

## 2022-08-06 DIAGNOSIS — M5412 Radiculopathy, cervical region: Secondary | ICD-10-CM | POA: Diagnosis not present

## 2022-08-11 DIAGNOSIS — M50122 Cervical disc disorder at C5-C6 level with radiculopathy: Secondary | ICD-10-CM | POA: Diagnosis not present

## 2022-08-11 DIAGNOSIS — S0500XA Injury of conjunctiva and corneal abrasion without foreign body, unspecified eye, initial encounter: Secondary | ICD-10-CM | POA: Diagnosis not present

## 2022-08-11 DIAGNOSIS — M5412 Radiculopathy, cervical region: Secondary | ICD-10-CM | POA: Diagnosis not present

## 2022-08-11 DIAGNOSIS — M50222 Other cervical disc displacement at C5-C6 level: Secondary | ICD-10-CM | POA: Diagnosis not present

## 2022-08-11 DIAGNOSIS — M50221 Other cervical disc displacement at C4-C5 level: Secondary | ICD-10-CM | POA: Diagnosis not present

## 2022-08-11 DIAGNOSIS — M50121 Cervical disc disorder at C4-C5 level with radiculopathy: Secondary | ICD-10-CM | POA: Diagnosis not present

## 2022-08-11 HISTORY — PX: CERVICAL SPINE SURGERY: SHX589

## 2022-08-15 ENCOUNTER — Ambulatory Visit: Payer: Self-pay | Admitting: *Deleted

## 2022-08-15 NOTE — Telephone Encounter (Signed)
  Chief Complaint: elevated BP 150/98 and 160/105 today . Symptoms: recent neck surgery 08/11/22. Denies sx but also reported feels pain around chest but feels it soreness from surgery.  Frequency: since 08/11/22 Pertinent Negatives: Patient denies chest pain no difficulty breathing no dizziness no headache, no blurred vision, no speech difficulty no weakness N/T on either side of body no decrease balance  Disposition: '[]'$ ED /'[x]'$ Urgent Care (no appt availability in office) / '[]'$ Appointment(In office/virtual)/ '[]'$  [place] [facility]/ '[]'$ Home Care/ '[]'$ Refused Recommended Disposition /'[]'$ [facility]/ '[]'$  Follow-up with PCP Additional Notes:   No available appt until 08/29/22 with PCP. Patient has not been seen in office in approx 2 years. Was taking lisinopril and stopped greater than a year ago due weight loss  recommended UC due to elevated BP. Please advise if patient can be seen or lisinopril can be refilled.     Reason for Disposition  Systolic BP  >= 654 OR Diastolic >= 650  Answer Assessment - Initial Assessment Questions 1. BLOOD PRESSURE: "What is the blood pressure?" "Did you take at least two measurements 5 minutes apart?"     BP 150/98 rechecked for BP 160/105 2. ONSET: "When did you take your blood pressure?"     Now  3. HOW: "How did you take your blood pressure?" (e.g., automatic home BP monitor, visiting nurse)     Automatic BP monitor 4. HISTORY: "Do you have a history of high blood pressure?"     No hx but has taken lisinopril and stopped greater than a year ago  5. MEDICINES: "Are you taking any medicines for blood pressure?" "Have you missed any doses recently?"     Was taking lisinopril but stopped due to weight loss  6. OTHER SYMPTOMS: "Do you have any symptoms?" (e.g., blurred vision, chest pain, difficulty breathing, headache, weakness)     Neck surgery with pain since 08/11/22.  7. PREGNANCY: "Is there any chance you are pregnant?" "When was your last menstrual  period?"     na  Protocols used: Blood Pressure - High-A-AH

## 2022-08-19 ENCOUNTER — Encounter: Payer: Self-pay | Admitting: Family Medicine

## 2022-08-19 ENCOUNTER — Ambulatory Visit (INDEPENDENT_AMBULATORY_CARE_PROVIDER_SITE_OTHER): Payer: BC Managed Care – PPO | Admitting: Family Medicine

## 2022-08-19 VITALS — BP 116/77 | HR 64 | Temp 98.4°F | Ht 71.0 in | Wt 221.7 lb

## 2022-08-19 DIAGNOSIS — I10 Essential (primary) hypertension: Secondary | ICD-10-CM | POA: Diagnosis not present

## 2022-08-19 DIAGNOSIS — Z114 Encounter for screening for human immunodeficiency virus [HIV]: Secondary | ICD-10-CM | POA: Diagnosis not present

## 2022-08-19 DIAGNOSIS — Z8639 Personal history of other endocrine, nutritional and metabolic disease: Secondary | ICD-10-CM

## 2022-08-19 DIAGNOSIS — E291 Testicular hypofunction: Secondary | ICD-10-CM | POA: Insufficient documentation

## 2022-08-19 DIAGNOSIS — Z1159 Encounter for screening for other viral diseases: Secondary | ICD-10-CM

## 2022-08-19 DIAGNOSIS — M5412 Radiculopathy, cervical region: Secondary | ICD-10-CM | POA: Insufficient documentation

## 2022-08-19 DIAGNOSIS — Z125 Encounter for screening for malignant neoplasm of prostate: Secondary | ICD-10-CM

## 2022-08-19 DIAGNOSIS — E782 Mixed hyperlipidemia: Secondary | ICD-10-CM | POA: Diagnosis not present

## 2022-08-19 NOTE — Progress Notes (Signed)
BP 116/77   Pulse 64   Temp 98.4 F (36.9 C) (Oral)   Ht '5\' 11"'$  (1.803 m)   Wt 221 lb 11.2 oz (100.6 kg)   SpO2 98%   BMI 30.92 kg/m    Subjective:    Patient ID: Matthew Hartman, male    DOB: [birth year], 46 y.o.   MRN: [id number]  HPI: Matthew Hartman is a 46 y.o. male  Chief Complaint  Patient presents with   Hypertension   HYPERTENSION / HYPERLIPIDEMIA- has been off his medicine for about a year, but BP has been doing well at home. He notes that he stopped his lisinopril.  Satisfied with current treatment? yes Duration of hypertension: chronic BP monitoring frequency: a few times a month BP medication side effects: no Past BP meds: lisinopril- was running high last week and has been taking old medication  Duration of hyperlipidemia: chronic Cholesterol medication side effects: no Cholesterol supplements: none Past cholesterol medications: none Medication compliance: excellent compliance Aspirin: no Recent stressors: no Recurrent headaches: no Visual changes: no Palpitations: no Dyspnea: no Chest pain: no Lower extremity edema: no Dizzy/lightheaded: no  Has been having issues with his neck. He had a cervical fusion a week ago.   LOW TESTOSTERONE Duration: chronic Status: controlled  Satisfied with current treatment:  yes Previous testosterone therapies: IM testosterone and arimedex Medication side effects:  no Medication compliance: good compliance Decreased libido: no Fatigue: no Depressed mood: no Muscle weakness: no Erectile dysfunction: no  Relevant past medical, surgical, family and social history reviewed and updated as indicated. Interim medical history since our last visit reviewed. Allergies and medications reviewed and updated.  Review of Systems  Constitutional: Negative.   HENT: Negative.    Eyes:  Positive for pain. Negative for photophobia, discharge, redness, itching and visual disturbance.  Respiratory: Negative.     Cardiovascular: Negative.   Musculoskeletal: Negative.   Neurological: Negative.   Psychiatric/Behavioral: Negative.      Per HPI unless specifically indicated above     Objective:    BP 116/77   Pulse 64   Temp 98.4 F (36.9 C) (Oral)   Ht '5\' 11"'$  (1.803 m)   Wt 221 lb 11.2 oz (100.6 kg)   SpO2 98%   BMI 30.92 kg/m   Wt Readings from Last 3 Encounters:  08/19/22 221 lb 11.2 oz (100.6 kg)  02/14/22 220 lb (99.8 kg)  01/06/20 228 lb (103.4 kg)    Physical Exam Vitals and nursing note reviewed.  Constitutional:      General: He is not in acute distress.    Appearance: Normal appearance. He is not ill-appearing, toxic-appearing or diaphoretic.  HENT:     Head: Normocephalic and atraumatic.     Right Ear: External ear normal.     Left Ear: External ear normal.     Nose: Nose normal.     Mouth/Throat:     Mouth: Mucous membranes are moist.     Pharynx: Oropharynx is clear.  Eyes:     General: No scleral icterus.       Right eye: No discharge.        Left eye: No discharge.     Extraocular Movements: Extraocular movements intact.     Conjunctiva/sclera: Conjunctivae normal.     Pupils: Pupils are equal, round, and reactive to light.  Cardiovascular:     Rate and Rhythm: Normal rate and regular rhythm.     Pulses: Normal pulses.     Heart  sounds: Normal heart sounds. No murmur heard.    No friction rub. No gallop.  Pulmonary:     Effort: Pulmonary effort is normal. No respiratory distress.     Breath sounds: Normal breath sounds. No stridor. No wheezing, rhonchi or rales.  Chest:     Chest wall: No tenderness.  Musculoskeletal:        General: Normal range of motion.     Cervical back: Normal range of motion and neck supple.     Comments: Surgical scar on anterior neck  Skin:    General: Skin is warm and dry.     Capillary Refill: Capillary refill takes less than 2 seconds.     Coloration: Skin is not jaundiced or pale.     Findings: No bruising, erythema,  lesion or rash.  Neurological:     General: No focal deficit present.     Mental Status: He is alert and oriented to person, place, and time. Mental status is at baseline.  Psychiatric:        Mood and Affect: Mood normal.        Behavior: Behavior normal.        Thought Content: Thought content normal.        Judgment: Judgment normal.     Results for orders placed or performed during the hospital encounter of 02/14/22  Urinalysis, Routine w reflex microscopic  Result Value Ref Range   Color, Urine YELLOW (A) YELLOW   APPearance CLEAR (A) CLEAR   Specific Gravity, Urine 1.021 1.005 - 1.030   pH 6.0 5.0 - 8.0   Glucose, UA NEGATIVE NEGATIVE mg/dL   Hgb urine dipstick NEGATIVE NEGATIVE   Bilirubin Urine NEGATIVE NEGATIVE   Ketones, ur NEGATIVE NEGATIVE mg/dL   Protein, ur NEGATIVE NEGATIVE mg/dL   Nitrite NEGATIVE NEGATIVE   Leukocytes,Ua NEGATIVE NEGATIVE  Basic metabolic panel  Result Value Ref Range   Sodium 138 135 - 145 mmol/L   Potassium 4.0 3.5 - 5.1 mmol/L   Chloride 106 98 - 111 mmol/L   CO2 28 22 - 32 mmol/L   Glucose, Bld 93 70 - 99 mg/dL   BUN 21 (H) 6 - 20 mg/dL   Creatinine, Ser 1.17 0.61 - 1.24 mg/dL   Calcium 9.2 8.9 - 10.3 mg/dL   GFR, Estimated >60 >60 mL/min   Anion gap 4 (L) 5 - 15  CBC  Result Value Ref Range   WBC 8.9 4.0 - 10.5 K/uL   RBC 5.65 4.22 - 5.81 MIL/uL   Hemoglobin 17.3 (H) 13.0 - 17.0 g/dL   HCT 51.5 39.0 - 52.0 %   MCV 91.2 80.0 - 100.0 fL   MCH 30.6 26.0 - 34.0 pg   MCHC 33.6 30.0 - 36.0 g/dL   RDW 12.4 11.5 - 15.5 %   Platelets 231 150 - 400 K/uL   nRBC 0.0 0.0 - 0.2 %      Assessment & Plan:   Problem List Items Addressed This Visit       Cardiovascular and Mediastinum   HTN (hypertension) - Primary    Under good control today. Will check labs and microalbumin. Recheck 1 month at physical. Call with any concerns.       Relevant Medications   lisinopril (ZESTRIL) 5 MG tablet   Other Relevant Orders   Comprehensive  metabolic panel   CBC with Differential/Platelet   TSH   Urinalysis, Routine w reflex microscopic   Microalbumin, Urine Waived     Nervous  and Auditory   Cervical radiculopathy    S/P fusion. Feeling much better. Continue to follow with neurosurgery. Call with any concerns. Continue to monitor.         Other   HLD (hyperlipidemia)    Rechecking labs today. Await results. Treat as needed.       Relevant Medications   lisinopril (ZESTRIL) 5 MG tablet   Other Relevant Orders   Comprehensive metabolic panel   CBC with Differential/Platelet   Lipid Panel w/o Chol/HDL Ratio   History of hypogonadism    Was doing injectable testosterone with an online doctor up until about 2 weeks ago. Has been on that medicine for about a month. He has been also taking arimidex.       Relevant Orders   Testosterone, free, total([facility])   Other Visit Diagnoses     Screening for prostate cancer       Labs drawn today. Await results.   Relevant Orders   PSA   Need for hepatitis C screening test       Labs drawn today. Await results.   Relevant Orders   Hepatitis C Antibody   Screening for HIV (human immunodeficiency virus)       Labs drawn today. Await results.   Relevant Orders   HIV Antibody (routine testing w rflx)        Follow up plan: Return in about 4 weeks (around 09/16/2022) for physical.

## 2022-08-19 NOTE — Assessment & Plan Note (Signed)
Under good control today. Will check labs and microalbumin. Recheck 1 month at physical. Call with any concerns.

## 2022-08-19 NOTE — Assessment & Plan Note (Signed)
S/P fusion. Feeling much better. Continue to follow with neurosurgery. Call with any concerns. Continue to monitor.

## 2022-08-19 NOTE — Assessment & Plan Note (Addendum)
Was doing injectable testosterone with an online doctor up until about 2 weeks ago. Has been on that medicine for about a month. He has been also taking arimidex.

## 2022-08-19 NOTE — Assessment & Plan Note (Signed)
Rechecking labs today. Await results. Treat as needed.  °

## 2022-08-19 NOTE — Addendum Note (Signed)
Addended by: Valerie Roys on: 08/19/2022 02:04 PM   Modules accepted: Orders

## 2022-08-20 LAB — MICROALBUMIN / CREATININE URINE RATIO
Creatinine, Urine: 159.3 mg/dL
Microalb/Creat Ratio: 3 mg/g creat (ref 0–29)
Microalbumin, Urine: 4.6 ug/mL

## 2022-08-20 LAB — URINALYSIS, ROUTINE W REFLEX MICROSCOPIC
Bilirubin, UA: NEGATIVE
Glucose, UA: NEGATIVE
Ketones, UA: NEGATIVE
Leukocytes,UA: NEGATIVE
Nitrite, UA: NEGATIVE
Protein,UA: NEGATIVE
RBC, UA: NEGATIVE
Specific Gravity, UA: 1.02 (ref 1.005–1.030)
Urobilinogen, Ur: 0.2 mg/dL (ref 0.2–1.0)
pH, UA: 6 (ref 5.0–7.5)

## 2022-08-22 LAB — CBC WITH DIFFERENTIAL/PLATELET
Basophils Absolute: 0 10*3/uL (ref 0.0–0.2)
Basos: 0 %
EOS (ABSOLUTE): 0.1 10*3/uL (ref 0.0–0.4)
Eos: 1 %
Hematocrit: 55.7 % — ABNORMAL HIGH (ref 37.5–51.0)
Hemoglobin: 19.1 g/dL — ABNORMAL HIGH (ref 13.0–17.7)
Immature Grans (Abs): 0 10*3/uL (ref 0.0–0.1)
Immature Granulocytes: 0 %
Lymphocytes Absolute: 2.7 10*3/uL (ref 0.7–3.1)
Lymphs: 31 %
MCH: 31.3 pg (ref 26.6–33.0)
MCHC: 34.3 g/dL (ref 31.5–35.7)
MCV: 91 fL (ref 79–97)
Monocytes Absolute: 0.9 10*3/uL (ref 0.1–0.9)
Monocytes: 10 %
Neutrophils Absolute: 5 10*3/uL (ref 1.4–7.0)
Neutrophils: 58 %
Platelets: 281 10*3/uL (ref 150–450)
RBC: 6.1 x10E6/uL — ABNORMAL HIGH (ref 4.14–5.80)
RDW: 12.5 % (ref 11.6–15.4)
WBC: 8.7 10*3/uL (ref 3.4–10.8)

## 2022-08-22 LAB — COMPREHENSIVE METABOLIC PANEL
ALT: 26 IU/L (ref 0–44)
AST: 24 IU/L (ref 0–40)
Albumin/Globulin Ratio: 1.8 (ref 1.2–2.2)
Albumin: 4.9 g/dL (ref 4.1–5.1)
Alkaline Phosphatase: 73 IU/L (ref 44–121)
BUN/Creatinine Ratio: 15 (ref 9–20)
BUN: 21 mg/dL (ref 6–24)
Bilirubin Total: 0.5 mg/dL (ref 0.0–1.2)
CO2: 23 mmol/L (ref 20–29)
Calcium: 9.9 mg/dL (ref 8.7–10.2)
Chloride: 97 mmol/L (ref 96–106)
Creatinine, Ser: 1.43 mg/dL — ABNORMAL HIGH (ref 0.76–1.27)
Globulin, Total: 2.8 g/dL (ref 1.5–4.5)
Glucose: 98 mg/dL (ref 70–99)
Potassium: 5.1 mmol/L (ref 3.5–5.2)
Sodium: 138 mmol/L (ref 134–144)
Total Protein: 7.7 g/dL (ref 6.0–8.5)
eGFR: 62 mL/min/{1.73_m2} (ref >59)

## 2022-08-22 LAB — LIPID PANEL W/O CHOL/HDL RATIO
Cholesterol, Total: 242 mg/dL — ABNORMAL HIGH (ref 100–199)
HDL: 37 mg/dL — ABNORMAL LOW (ref >39)
LDL Chol Calc (NIH): 178 mg/dL — ABNORMAL HIGH (ref 0–99)
Triglycerides: 146 mg/dL (ref 0–149)
VLDL Cholesterol Cal: 27 mg/dL (ref 5–40)

## 2022-08-22 LAB — TESTOSTERONE, FREE, TOTAL, SHBG
Sex Hormone Binding: 20.8 nmol/L (ref 16.5–55.9)
Testosterone, Free: 10.9 pg/mL (ref 6.8–21.5)
Testosterone: 349 ng/dL (ref 264–916)

## 2022-08-22 LAB — HIV ANTIBODY (ROUTINE TESTING W REFLEX): HIV Screen 4th Generation wRfx: NONREACTIVE

## 2022-08-22 LAB — HEPATITIS C ANTIBODY: Hep C Virus Ab: NONREACTIVE

## 2022-08-22 LAB — PSA: Prostate Specific Ag, Serum: 0.9 ng/mL (ref 0.0–4.0)

## 2022-08-22 LAB — TSH: TSH: 2.54 u[IU]/mL (ref 0.450–4.500)

## 2022-08-28 DIAGNOSIS — Z09 Encounter for follow-up examination after completed treatment for conditions other than malignant neoplasm: Secondary | ICD-10-CM | POA: Diagnosis not present

## 2022-09-09 ENCOUNTER — Telehealth: Payer: Self-pay | Admitting: Family Medicine

## 2022-09-09 DIAGNOSIS — M542 Cervicalgia: Secondary | ICD-10-CM | POA: Diagnosis not present

## 2022-09-09 NOTE — Telephone Encounter (Signed)
Patients wife dropped off Adult Medical Report  from [employer]  to be completed by provider. When forms are completed patient would like formes emailed to the email address on file. Forms were placed in the providers folder for completion.

## 2022-09-16 ENCOUNTER — Ambulatory Visit (INDEPENDENT_AMBULATORY_CARE_PROVIDER_SITE_OTHER): Payer: BC Managed Care – PPO | Admitting: Family Medicine

## 2022-09-16 ENCOUNTER — Encounter: Payer: Self-pay | Admitting: Family Medicine

## 2022-09-16 VITALS — BP 133/87 | HR 67 | Temp 98.3°F | Ht 70.0 in | Wt 226.2 lb

## 2022-09-16 DIAGNOSIS — Z9889 Other specified postprocedural states: Secondary | ICD-10-CM | POA: Insufficient documentation

## 2022-09-16 DIAGNOSIS — Z0000 Encounter for general adult medical examination without abnormal findings: Secondary | ICD-10-CM | POA: Diagnosis not present

## 2022-09-16 DIAGNOSIS — Z1211 Encounter for screening for malignant neoplasm of colon: Secondary | ICD-10-CM

## 2022-09-16 DIAGNOSIS — M5020 Other cervical disc displacement, unspecified cervical region: Secondary | ICD-10-CM | POA: Insufficient documentation

## 2022-09-16 DIAGNOSIS — H93299 Other abnormal auditory perceptions, unspecified ear: Secondary | ICD-10-CM

## 2022-09-16 DIAGNOSIS — H93293 Other abnormal auditory perceptions, bilateral: Secondary | ICD-10-CM

## 2022-09-16 DIAGNOSIS — N200 Calculus of kidney: Secondary | ICD-10-CM | POA: Insufficient documentation

## 2022-09-16 NOTE — Progress Notes (Signed)
BP 133/87   Pulse 67   Temp 98.3 F (36.8 C) (Oral)   Ht '5\' 10"'$  (1.778 m)   Wt 226 lb 3.2 oz (102.6 kg)   SpO2 98%   BMI 32.46 kg/m    Subjective:    Patient ID: Matthew Hartman, male    DOB: 09-Mar-1976, 47 y.o.   MRN: [id number]  HPI: Matthew Hartman is a 47 y.o. male presenting on 09/16/2022 for comprehensive medical examination. Current medical complaints include:  Has been very sensitive to noise recently. Has been making him irritated.  He currently lives with: wife and kids Interim Problems from his last visit: no  Depression Screen done today and results listed below:     08/19/2022    9:06 AM 01/06/2020    8:29 AM 07/21/2019    9:11 AM  Depression screen PHQ 2/9  Decreased Interest 0 0 0  Down, Depressed, Hopeless 0 0 0  PHQ - 2 Score 0 0 0  Altered sleeping 0 0   Tired, decreased energy 0 0   Change in appetite 0 0   Feeling bad or failure about yourself  0 0   Trouble concentrating 0 0   Moving slowly or fidgety/restless 0 0   Suicidal thoughts 0 0   PHQ-9 Score 0 0   Difficult doing work/chores Not difficult at all Not difficult at all     Past Medical History:  Past Medical History:  Diagnosis Date   Hypertension     Surgical History:  Past Surgical History:  Procedure Laterality Date   CERVICAL SPINE SURGERY  08/11/2022   FRACTURE SURGERY Right    Has pins   KNEE SURGERY      Medications:  Current Outpatient Medications on File Prior to Visit  Medication Sig   TESTOSTERONE IM Inject into the muscle.   No current facility-administered medications on file prior to visit.    Allergies:  Allergies  Allergen Reactions   Ibuprofen Itching and Swelling    Social History:  Social History   Socioeconomic History   Marital status: Married    Spouse name: Not on file   Number of children: Not on file   Years of education: Not on file   Highest education level: Not on file  Occupational History   Not on file  Tobacco Use    Smoking status: Never   Smokeless tobacco: Never  Vaping Use   Vaping Use: Never used  Substance and Sexual Activity   Alcohol use: No   Drug use: No   Sexual activity: Yes    Birth control/protection: Surgical  Other Topics Concern   Not on file  Social History Narrative   Not on file   Social Determinants of Health   Financial Resource Strain: Not on file  Food Insecurity: Not on file  Transportation Needs: Not on file  Physical Activity: Not on file  Stress: Not on file  Social Connections: Not on file  Intimate Partner Violence: Not on file   Social History   Tobacco Use  Smoking Status Never  Smokeless Tobacco Never   Social History   Substance and Sexual Activity  Alcohol Use No    Family History:  Family History  Problem Relation Age of Onset   Hypertension Mother    Hyperlipidemia Mother    Hypertension Father    Heart attack Father    Hyperlipidemia Father    Diabetes Sister    Heart attack Maternal Grandfather  Diabetes Maternal Grandfather    COPD Paternal Grandmother    Cancer Paternal Grandfather        Stomach   Down syndrome Son     Past medical history, surgical history, medications, allergies, family history and social history reviewed with patient today and changes made to appropriate areas of the chart.   Review of Systems  Constitutional: Negative.   HENT: Negative.    Eyes: Negative.   Respiratory: Negative.    Cardiovascular: Negative.   Gastrointestinal: Negative.   Genitourinary: Negative.   Musculoskeletal: Negative.   Skin: Negative.   Neurological: Negative.   Endo/Heme/Allergies: Negative.   Psychiatric/Behavioral: Negative.     All other ROS negative except what is listed above and in the HPI.      Objective:    BP 133/87   Pulse 67   Temp 98.3 F (36.8 C) (Oral)   Ht '5\' 10"'$  (1.778 m)   Wt 226 lb 3.2 oz (102.6 kg)   SpO2 98%   BMI 32.46 kg/m   Wt Readings from Last 3 Encounters:  09/16/22 226 lb 3.2 oz  (102.6 kg)  08/19/22 221 lb 11.2 oz (100.6 kg)  02/14/22 220 lb (99.8 kg)    Physical Exam Vitals and nursing note reviewed.  Constitutional:      General: He is not in acute distress.    Appearance: Normal appearance. He is obese. He is not ill-appearing, toxic-appearing or diaphoretic.  HENT:     Head: Normocephalic and atraumatic.     Right Ear: Tympanic membrane, ear canal and external ear normal. There is no impacted cerumen.     Left Ear: Tympanic membrane, ear canal and external ear normal. There is no impacted cerumen.     Nose: Nose normal. No congestion or rhinorrhea.     Mouth/Throat:     Mouth: Mucous membranes are moist.     Pharynx: Oropharynx is clear. No oropharyngeal exudate or posterior oropharyngeal erythema.  Eyes:     General: No scleral icterus.       Right eye: No discharge.        Left eye: No discharge.     Extraocular Movements: Extraocular movements intact.     Conjunctiva/sclera: Conjunctivae normal.     Pupils: Pupils are equal, round, and reactive to light.  Neck:     Vascular: No carotid bruit.  Cardiovascular:     Rate and Rhythm: Normal rate and regular rhythm.     Pulses: Normal pulses.     Heart sounds: No murmur heard.    No friction rub. No gallop.  Pulmonary:     Effort: Pulmonary effort is normal. No respiratory distress.     Breath sounds: Normal breath sounds. No stridor. No wheezing, rhonchi or rales.  Chest:     Chest wall: No tenderness.  Abdominal:     General: Abdomen is flat. Bowel sounds are normal. There is no distension.     Palpations: Abdomen is soft. There is no mass.     Tenderness: There is no abdominal tenderness. There is no right CVA tenderness, left CVA tenderness, guarding or rebound.     Hernia: No hernia is present.  Genitourinary:    Comments: Genital exam deferred with shared decision making Musculoskeletal:        General: No swelling, tenderness, deformity or signs of injury.     Cervical back: Normal range  of motion and neck supple. No rigidity. No muscular tenderness.     Right lower leg:  No edema.     Left lower leg: No edema.  Lymphadenopathy:     Cervical: No cervical adenopathy.  Skin:    General: Skin is warm and dry.     Capillary Refill: Capillary refill takes less than 2 seconds.     Coloration: Skin is not jaundiced or pale.     Findings: No bruising, erythema, lesion or rash.  Neurological:     General: No focal deficit present.     Mental Status: He is alert and oriented to person, place, and time.     Cranial Nerves: No cranial nerve deficit.     Sensory: No sensory deficit.     Motor: No weakness.     Coordination: Coordination normal.     Gait: Gait normal.     Deep Tendon Reflexes: Reflexes normal.  Psychiatric:        Mood and Affect: Mood normal.        Behavior: Behavior normal.        Thought Content: Thought content normal.        Judgment: Judgment normal.     Results for orders placed or performed in visit on 08/19/22  Comprehensive metabolic panel  Result Value Ref Range   Glucose 98 70 - 99 mg/dL   BUN 21 6 - 24 mg/dL   Creatinine, Ser 1.43 (H) 0.76 - 1.27 mg/dL   eGFR 62 >59 mL/min/1.73   BUN/Creatinine Ratio 15 9 - 20   Sodium 138 134 - 144 mmol/L   Potassium 5.1 3.5 - 5.2 mmol/L   Chloride 97 96 - 106 mmol/L   CO2 23 20 - 29 mmol/L   Calcium 9.9 8.7 - 10.2 mg/dL   Total Protein 7.7 6.0 - 8.5 g/dL   Albumin 4.9 4.1 - 5.1 g/dL   Globulin, Total 2.8 1.5 - 4.5 g/dL   Albumin/Globulin Ratio 1.8 1.2 - 2.2   Bilirubin Total 0.5 0.0 - 1.2 mg/dL   Alkaline Phosphatase 73 44 - 121 IU/L   AST 24 0 - 40 IU/L   ALT 26 0 - 44 IU/L  CBC with Differential/Platelet  Result Value Ref Range   WBC 8.7 3.4 - 10.8 x10E3/uL   RBC 6.10 (H) 4.14 - 5.80 x10E6/uL   Hemoglobin 19.1 (H) 13.0 - 17.7 g/dL   Hematocrit 55.7 (H) 37.5 - 51.0 %   MCV 91 79 - 97 fL   MCH 31.3 26.6 - 33.0 pg   MCHC 34.3 31.5 - 35.7 g/dL   RDW 12.5 11.6 - 15.4 %   Platelets 281 150 - 450  x10E3/uL   Neutrophils 58 Not Estab. %   Lymphs 31 Not Estab. %   Monocytes 10 Not Estab. %   Eos 1 Not Estab. %   Basos 0 Not Estab. %   Neutrophils Absolute 5.0 1.4 - 7.0 x10E3/uL   Lymphocytes Absolute 2.7 0.7 - 3.1 x10E3/uL   Monocytes Absolute 0.9 0.1 - 0.9 x10E3/uL   EOS (ABSOLUTE) 0.1 0.0 - 0.4 x10E3/uL   Basophils Absolute 0.0 0.0 - 0.2 x10E3/uL   Immature Granulocytes 0 Not Estab. %   Immature Grans (Abs) 0.0 0.0 - 0.1 x10E3/uL  Lipid Panel w/o Chol/HDL Ratio  Result Value Ref Range   Cholesterol, Total 242 (H) 100 - 199 mg/dL   Triglycerides 146 0 - 149 mg/dL   HDL 37 (L) >39 mg/dL   VLDL Cholesterol Cal 27 5 - 40 mg/dL   LDL Chol Calc (NIH) 178 (H) 0 - 99 mg/dL  PSA  Result Value Ref Range   Prostate Specific Ag, Serum 0.9 0.0 - 4.0 ng/mL  TSH  Result Value Ref Range   TSH 2.540 0.450 - 4.500 uIU/mL  Urinalysis, Routine w reflex microscopic  Result Value Ref Range   Specific Gravity, UA 1.020 1.005 - 1.030   pH, UA 6.0 5.0 - 7.5   Color, UA Yellow Yellow   Appearance Ur Clear Clear   Leukocytes,UA Negative Negative   Protein,UA Negative Negative/Trace   Glucose, UA Negative Negative   Ketones, UA Negative Negative   RBC, UA Negative Negative   Bilirubin, UA Negative Negative   Urobilinogen, Ur 0.2 0.2 - 1.0 mg/dL   Nitrite, UA Negative Negative   Microscopic Examination Comment   HIV Antibody (routine testing w rflx)  Result Value Ref Range   HIV Screen 4th Generation wRfx Non Reactive Non Reactive  Hepatitis C Antibody  Result Value Ref Range   Hep C Virus Ab Non Reactive Non Reactive  Testosterone, free, total([facility])  Result Value Ref Range   Testosterone 349 264 - 916 ng/dL   Testosterone, Free 10.9 6.8 - 21.5 pg/mL   Sex Hormone Binding 20.8 16.5 - 55.9 nmol/L  Urine Microalbumin w/creat. ratio  Result Value Ref Range   Creatinine, Urine 159.3 Not Estab. mg/dL   Microalbumin, Urine 4.6 Not Estab. ug/mL   Microalb/Creat Ratio 3 0 - 29  mg/g creat      Assessment & Plan:   Problem List Items Addressed This Visit       Other   Misophonia    Discussed coping mechanisms and information provided. Continue to monitor. Call with any concerns.       Other Visit Diagnoses     Routine general medical examination at a health care facility    -  Primary   Vaccines up to date. Screening labs checked today. Colonoscopy up to date. Continue diet and exercise. Call with any concerns.   Screening for colon cancer       Colonoscopy ordered today.   Relevant Orders   Ambulatory referral to Gastroenterology       LABORATORY TESTING:  Health maintenance labs ordered today as discussed above.   The natural history of prostate cancer and ongoing controversy regarding screening and potential treatment outcomes of prostate cancer has been discussed with the patient. The meaning of a false positive PSA and a false negative PSA has been discussed. He indicates understanding of the limitations of this screening test and wishes to proceed with screening PSA testing.   IMMUNIZATIONS:   - Tdap: Tetanus vaccination status reviewed: Td vaccination indicated and given today. - Influenza: Refused - Pneumovax: Not applicable - Prevnar: Not applicable - COVID: Refused - HPV: Not applicable - Shingrix vaccine: Not applicable  SCREENING: - Colonoscopy: Ordered today  Discussed with patient purpose of the colonoscopy is to detect colon cancer at curable precancerous or early stages   PATIENT COUNSELING:    Sexuality: Discussed sexually transmitted diseases, partner selection, use of condoms, avoidance of unintended pregnancy  and contraceptive alternatives.   Advised to avoid cigarette smoking.  I discussed with the patient that most people either abstain from alcohol or drink within safe limits (<=14/week and <=4 drinks/occasion for males, <=7/weeks and <= 3 drinks/occasion for females) and that the risk for alcohol disorders and other  health effects rises proportionally with the number of drinks per week and how often a drinker exceeds daily limits.  Discussed cessation/primary prevention of drug use and  availability of treatment for abuse.   Diet: Encouraged to adjust caloric intake to maintain  or achieve ideal body weight, to reduce intake of dietary saturated fat and total fat, to limit sodium intake by avoiding high sodium foods and not adding table salt, and to maintain adequate dietary potassium and calcium preferably from fresh fruits, vegetables, and low-fat dairy products.    stressed the importance of regular exercise  Injury prevention: Discussed safety belts, safety helmets, smoke detector, smoking near bedding or upholstery.   Dental health: Discussed importance of regular tooth brushing, flossing, and dental visits.   Follow up plan: NEXT PREVENTATIVE PHYSICAL DUE IN 1 YEAR. Return in about 1 year (around 09/17/2023) for physical.

## 2022-09-16 NOTE — Assessment & Plan Note (Signed)
Discussed coping mechanisms and information provided. Continue to monitor. Call with any concerns.

## 2022-09-16 NOTE — Patient Instructions (Signed)
Misophonia: When sounds really do make you "crazy" March 01, 2018 By Lilia Argue, PhD, Contributing Editor [phone] You hear your spouse breathing nearby and you instantly get angry. Your 47-year-old yawns and it triggers a fight-or-flight reaction in you. You avoid restaurants because you can't stand the sound of chewing. Sounds other people don't even seem to notice, drive you up a wall. You might have misophonia.  What is misophonia? People with misophonia are affected emotionally by common sounds -- usually those made by others, and usually ones that other people don't pay attention to. The examples above (breathing, yawning, or chewing) create a fight-or-flight response that triggers anger and a desire to escape. Misophonia is little studied and we don't know how common it is. It affects some worse than others and can lead to isolation, as people suffering from this condition try to avoid these trigger sounds. People who have misophonia often feel embarrassed and don't mention it to healthcare providers -- and often healthcare providers haven't heard of it anyway. Nonetheless, misophonia is a real disorder and one that seriously compromises functioning, socializing, and ultimately mental health. Misophonia usually appears around age 47, and likely affects more people than we realize.  Protect yourself from the damage of chronic inflammation. Science has proven that chronic, low-grade inflammation can turn into a silent killer that contributes to cardiovascular disease, cancer, type 2 diabetes and other conditions. Get simple tips to fight inflammation and stay healthy -- from [facility] experts.  LEARN MORE View Protect yourself from the damage of chronic inflammation.Couple running in woods What causes misophonia? New research has started to identify causes for misophonia. A [place]-based research team studied 20 adults with misophonia and 22 without it. They all rated the  unpleasantness of different sounds, including common trigger sounds (eating and breathing), universally disturbing sounds (of babies crying and people screaming), and neutral sounds (such as rain). As expected, persons with misophonia rated the trigger sounds of eating and breathing as highly disturbing while those without it did not. Both groups rated the unpleasantness of babies crying and people screaming about the same, as they did the neutral sounds. This confirmed that the misophonic persons were far more affected by specific trigger sounds, but don't differ much from others regarding other types of sounds.  The researchers also noted that persons with misophonia showed much greater physiological signs of stress (increased sweat and heart rate) to the trigger sounds of eating and breathing than those without it. No significant difference was found between the groups for the neutral sounds or the disturbing sounds of a baby crying or people screaming.  The brain science of misophonia The team's important finding was in a part of the brain that plays a role both in anger and in integrating outside inputs (such as sounds) with inputs from organs such as the heart and lungs: the anterior insular cortex (AIC). Using fMRI scans to measure brain activity, the researchers found that the [facility] caused much more activity in other parts of the brain during the trigger sounds for those with misophonia than for the control group. Specifically, the parts of the brain responsible for long-term memories, fear, and other emotions were activated. This makes sense, since people with misophonia have strong emotional reactions to common sounds; more importantly, it demonstrates that these parts of the brain are the ones responsible for the experience of misophonia.  The researchers also used whole-brain MRI scans to map participants' brains and found that people with misophonia have  higher amounts of myelination. Myelin is a  fatty substance that wraps around nerve cells in the brain to provide electrical insulation, like the insulation on a wire. It's not known if the extra myelin is a cause or an effect of misophonia and its triggering of other brain areas.  There is some good news regarding misophonia Misophonia clinics exist throughout the [place] and elsewhere, and treatments such as auditory distraction (with white noise or headphones) and cognitive behavioral therapy have shown some success in improving functioning. For more information, contact the Misophonia Association.

## 2022-09-18 NOTE — Telephone Encounter (Signed)
Copied from [place] [id number]. Topic: General - Other >> Sep 18, 2022 10:38 AM Cyndi Bender wrote: Reason for CRM: Pt spouse Lars Mage called to see if the paperwork was ready for pick up. Lars Mage stated they were told that the paperwork would be completed on Tuesday or Wednesday of last week. She also stated that there was no fax# to fax the paperwork so she needs someone to call her back with an update on the paperwork.

## 2022-09-20 IMAGING — CT CT RENAL STONE PROTOCOL
2 of 4 series · 16 of 46 positions shown, 18 images · non-contrast
Comparison: CT AP, 06/24/2019.

CLINICAL DATA: Flank pain, kidney stone suspected



[Series 2: stone full standard · axial · 0.79mm/px · z∈[-1136,-606]mm · 13 of 116 slices shown, 15 images]
[im 5/116  soft-tissue]
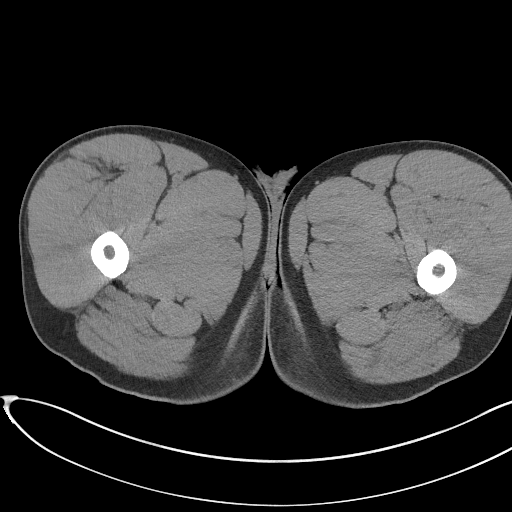
[im 5/116  bone]
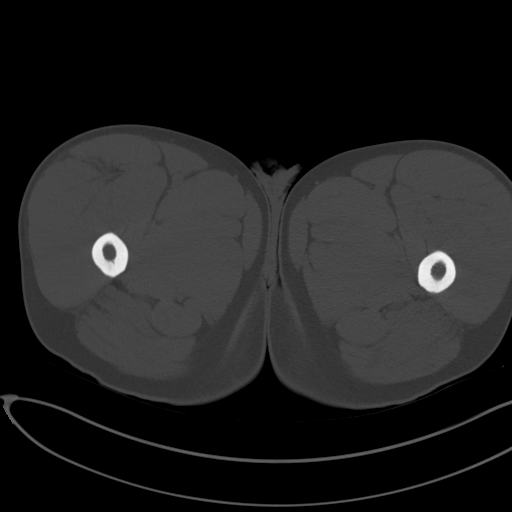
[im 14/116  soft-tissue]
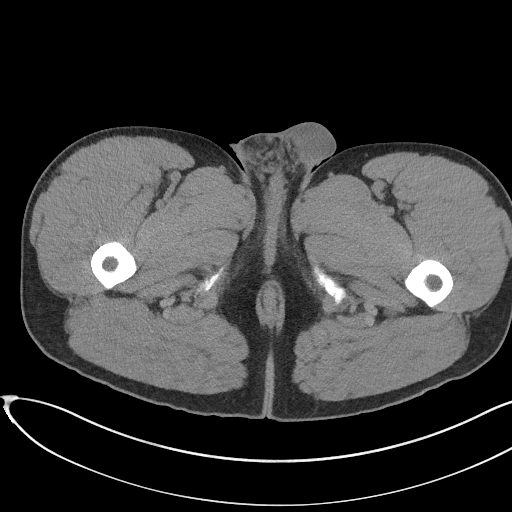
[im 23/116  soft-tissue]
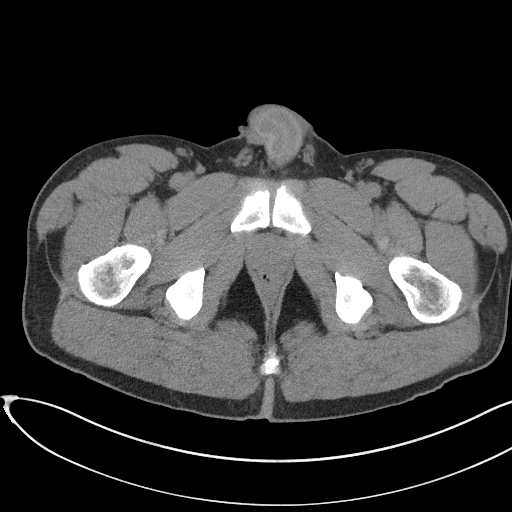
[im 31/116  soft-tissue]
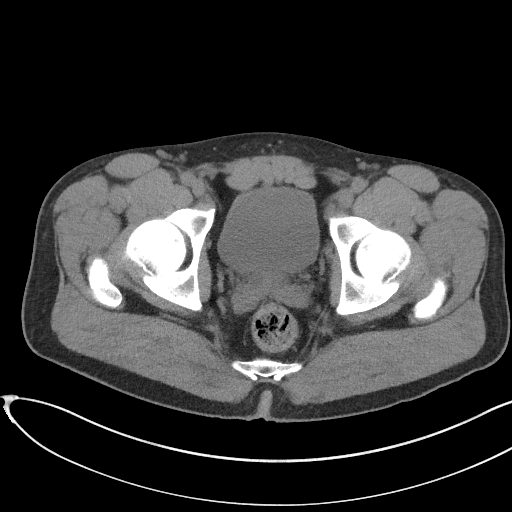
[im 40/116  soft-tissue]
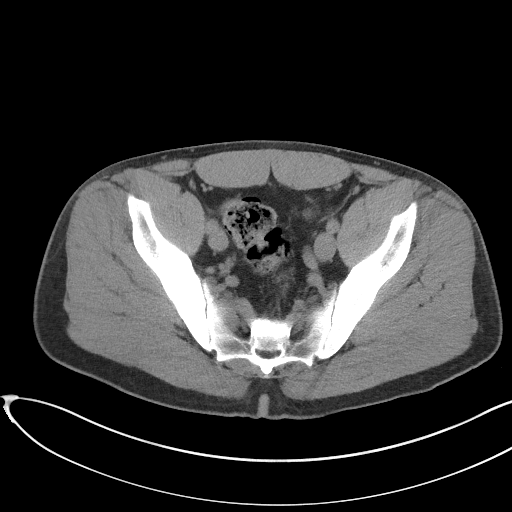
[im 49/116  soft-tissue]
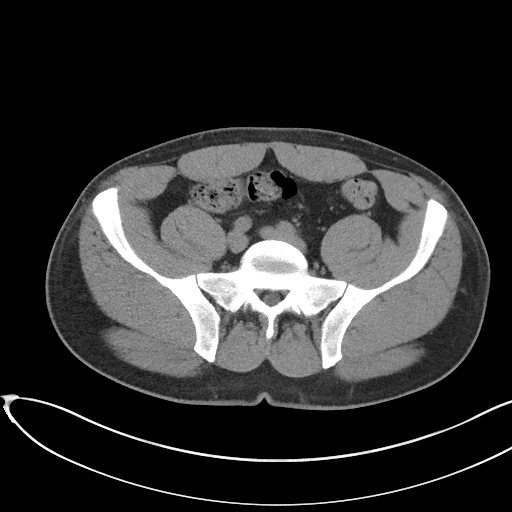
[im 58/116  soft-tissue]
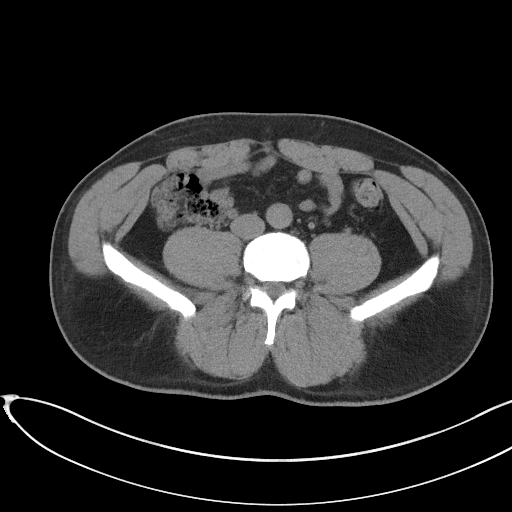
[im 67/116  soft-tissue]
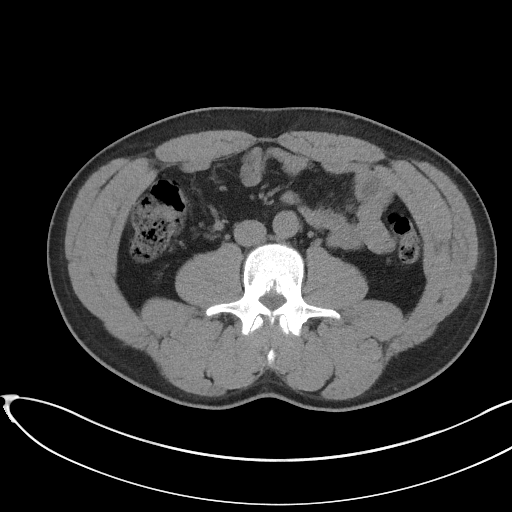
[im 76/116  soft-tissue]
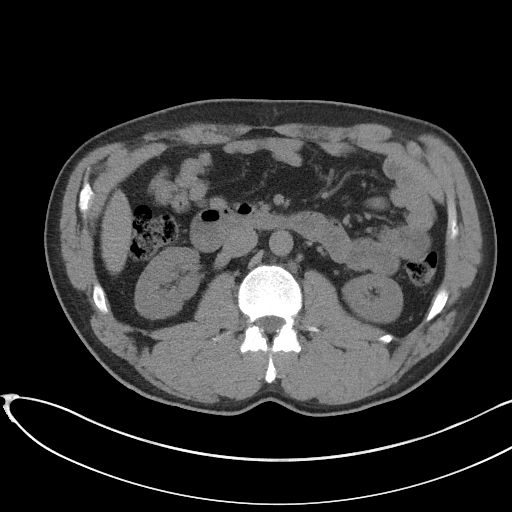
[im 76/116  bone]
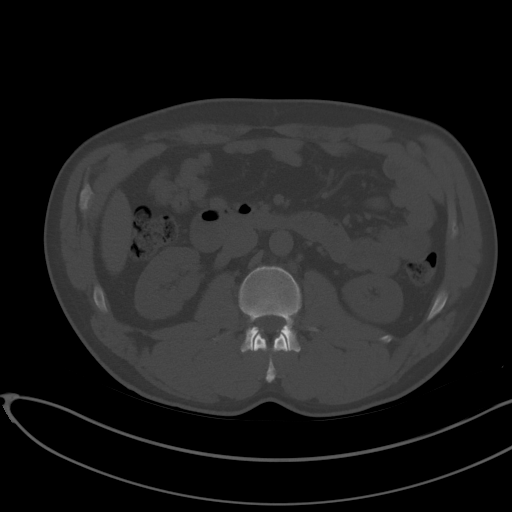
[im 85/116  soft-tissue]
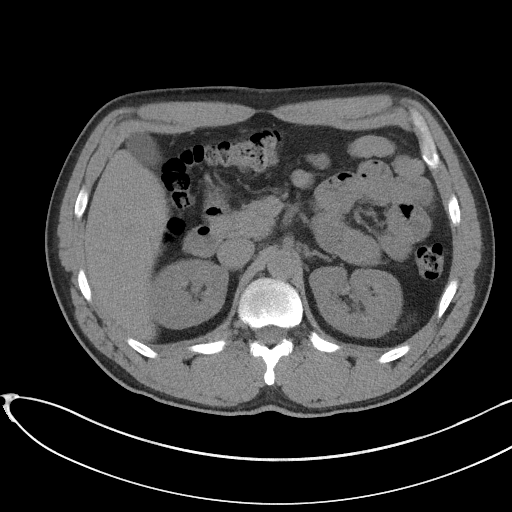
[im 93/116  soft-tissue]
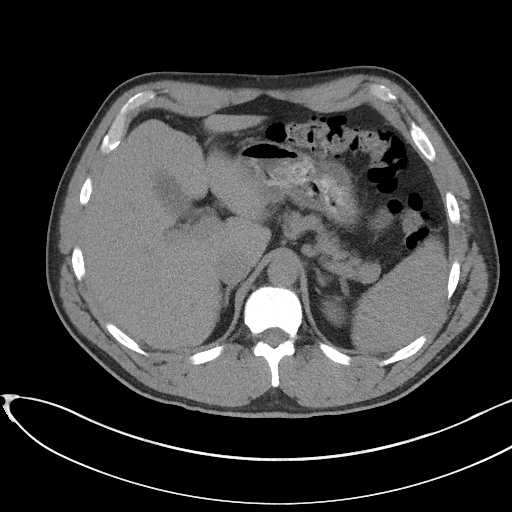
[im 102/116  soft-tissue]
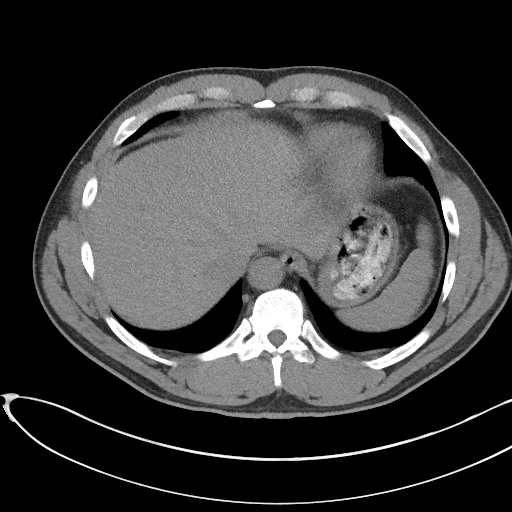
[im 111/116  soft-tissue]
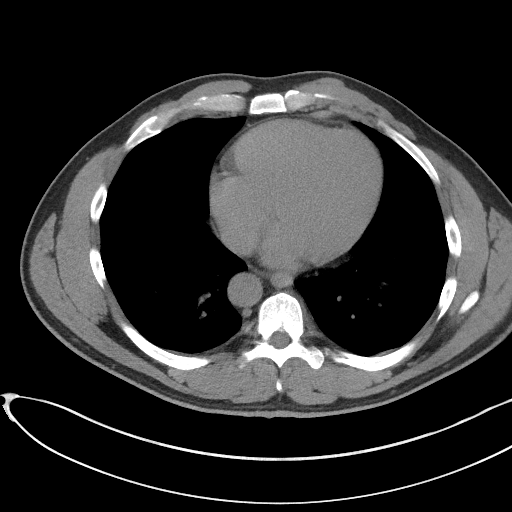

[Series 5: coronal · coronal · 0.83mm/px · 3 of 137 slices shown]
[im 46/137  soft-tissue]
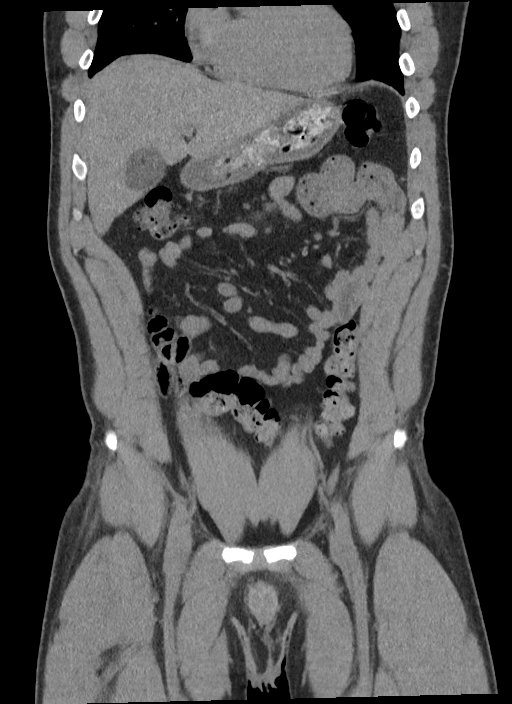
[im 61/137  soft-tissue]
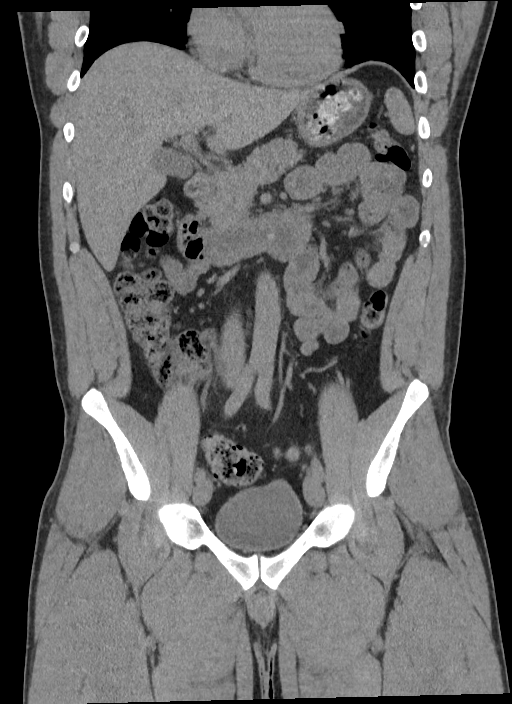
[im 76/137  soft-tissue]
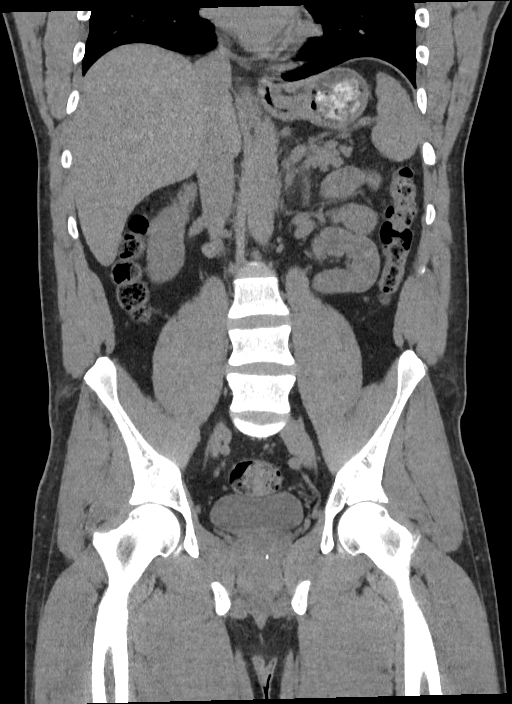

[16 of 46 positions shown; findings below may reference images not displayed]

FINDINGS: Lower chest: No acute abnormality.

Hepatobiliary: Normal noncontrast appearance without discrete focal
liver abnormality. No gallstones, gallbladder wall thickening, or
biliary dilatation.

Pancreas: No pancreatic ductal dilatation or surrounding
inflammatory changes.

Spleen: Normal in size without focal abnormality.

Adrenals/Urinary Tract: Adrenal glands are unremarkable. Punctate,
nonobstructing RIGHT renal stones. The LEFT kidney is normal. No
without focal lesion, or hydronephrosis. Bladder is unremarkable.

Stomach/Bowel: Stomach is within normal limits. Appendix appears
normal. No evidence of bowel wall thickening, distention, or
inflammatory changes.

Vascular/Lymphatic: No enlarged abdominal or pelvic lymph nodes.

Reproductive: Nonenlarged prostate. Punctate radiodensity projecting
at the level of the prostatic urethra.

Other: No abdominal wall hernia or abnormality. No abdominopelvic
ascites.

Musculoskeletal: No acute osseous findings.
IMPRESSION: 1. Punctate radiodensity at the level of the prostatic urethra.
Findings suspicious for a renal stone in transit.
2. Punctate nonobstructing RIGHT nephrolithiasis.

## 2022-09-23 ENCOUNTER — Telehealth: Payer: Self-pay

## 2022-09-23 ENCOUNTER — Other Ambulatory Visit: Payer: Self-pay

## 2022-09-23 DIAGNOSIS — Z1211 Encounter for screening for malignant neoplasm of colon: Secondary | ICD-10-CM

## 2022-09-23 MED ORDER — NA SULFATE-K SULFATE-MG SULF 17.5-3.13-1.6 GM/177ML PO SOLN: 1.0000 | Freq: Once | ORAL | 0 refills | Status: AC

## 2022-09-23 NOTE — Telephone Encounter (Signed)
Paperwork filled out

## 2022-09-23 NOTE — Telephone Encounter (Signed)
Gastroenterology Pre-Procedure Review  Request Date: 10/09/22 Requesting Physician: Dr. Vicente Males  PATIENT REVIEW QUESTIONS: The patient responded to the following health history questions as indicated:    1. Are you having any GI issues? no 2. Do you have a personal history of Polyps? no 3. Do you have a family history of Colon Cancer or Polyps? no 4. Diabetes Mellitus? no 5. Joint replacements in the past 12 months? Spinal fusion C4-C6  December 3rd. Pt states he is able to lay on his back and side comfortable and will be released from [facility] tomorrow ([facility]) 6. Major health problems in the past 3 months?yes (see above note) 7. Any artificial heart valves, MVP, or defibrillator?no    MEDICATIONS & ALLERGIES:    Patient reports the following regarding taking any anticoagulation/antiplatelet therapy:   Plavix, Coumadin, Eliquis, Xarelto, Lovenox, Pradaxa, Brilinta, or Effient? no Aspirin? no  Patient confirms/reports the following medications:  Current Outpatient Medications  Medication Sig Dispense Refill   TESTOSTERONE IM Inject into the muscle.     No current facility-administered medications for this visit.    Patient confirms/reports the following allergies:  Allergies  Allergen Reactions   Ibuprofen Itching and Swelling    No orders of the defined types were placed in this encounter.   AUTHORIZATION INFORMATION Primary Insurance: 1D#: Group #:  Secondary Insurance: 1D#: Group #:  SCHEDULE INFORMATION: Date:  Time: Location:

## 2022-09-24 DIAGNOSIS — Z981 Arthrodesis status: Secondary | ICD-10-CM | POA: Diagnosis not present

## 2022-10-09 ENCOUNTER — Other Ambulatory Visit: Payer: Self-pay

## 2022-10-09 ENCOUNTER — Encounter: Payer: Self-pay | Admitting: Gastroenterology

## 2022-10-09 ENCOUNTER — Ambulatory Visit: Payer: BC Managed Care – PPO | Admitting: Registered Nurse

## 2022-10-09 ENCOUNTER — Encounter
Admission: RE | Disposition: A | Discharge status: Home / Self Care | Payer: Self-pay | Source: Home / Self Care | Attending: Gastroenterology

## 2022-10-09 ENCOUNTER — Ambulatory Visit
Admission: RE | Admit: 2022-10-09 | Discharge: 2022-10-09 | Disposition: A | Discharge status: Home / Self Care | Payer: BC Managed Care – PPO | Attending: Gastroenterology | Admitting: Gastroenterology

## 2022-10-09 DIAGNOSIS — I10 Essential (primary) hypertension: Secondary | ICD-10-CM | POA: Diagnosis not present

## 2022-10-09 DIAGNOSIS — Z1211 Encounter for screening for malignant neoplasm of colon: Secondary | ICD-10-CM | POA: Diagnosis not present

## 2022-10-09 DIAGNOSIS — D126 Benign neoplasm of colon, unspecified: Secondary | ICD-10-CM

## 2022-10-09 DIAGNOSIS — D122 Benign neoplasm of ascending colon: Secondary | ICD-10-CM | POA: Diagnosis not present

## 2022-10-09 DIAGNOSIS — K635 Polyp of colon: Secondary | ICD-10-CM | POA: Diagnosis not present

## 2022-10-09 DIAGNOSIS — D125 Benign neoplasm of sigmoid colon: Secondary | ICD-10-CM | POA: Insufficient documentation

## 2022-10-09 DIAGNOSIS — E785 Hyperlipidemia, unspecified: Secondary | ICD-10-CM | POA: Diagnosis not present

## 2022-10-09 HISTORY — PX: COLONOSCOPY WITH PROPOFOL: SHX5780

## 2022-10-09 SURGERY — COLONOSCOPY WITH PROPOFOL
Anesthesia: General

## 2022-10-09 MED ORDER — LIDOCAINE HCL (CARDIAC) PF 100 MG/5ML IV SOSY
PREFILLED_SYRINGE | INTRAVENOUS | Status: DC | PRN
  Administered 2022-10-09: 100 mg via INTRAVENOUS

## 2022-10-09 MED ORDER — PROPOFOL 500 MG/50ML IV EMUL
INTRAVENOUS | Status: DC | PRN
  Administered 2022-10-09: 170.473 ug/kg/min via INTRAVENOUS

## 2022-10-09 MED ORDER — PHENYLEPHRINE HCL (PRESSORS) 10 MG/ML IV SOLN
INTRAVENOUS | Status: DC | PRN
  Administered 2022-10-09: 160 ug via INTRAVENOUS

## 2022-10-09 MED ORDER — SODIUM CHLORIDE 0.9 % IV SOLN: INTRAVENOUS | Status: DC

## 2022-10-09 MED ORDER — DEXMEDETOMIDINE HCL 200 MCG/2ML IV SOLN
INTRAVENOUS | Status: DC | PRN
  Administered 2022-10-09: 16 ug via INTRAVENOUS

## 2022-10-09 MED ORDER — PROPOFOL 10 MG/ML IV BOLUS
INTRAVENOUS | Status: DC | PRN
  Administered 2022-10-09: 120 mg via INTRAVENOUS

## 2022-10-09 NOTE — Anesthesia Postprocedure Evaluation (Signed)
Anesthesia Post Note  Patient: Matthew Hartman  Procedure(s) Performed: COLONOSCOPY WITH PROPOFOL  Patient location during evaluation: Endoscopy Anesthesia Type: General Level of consciousness: awake and alert Pain management: pain level controlled Vital Signs Assessment: post-procedure vital signs reviewed and stable Respiratory status: spontaneous breathing, nonlabored ventilation, respiratory function stable and patient connected to nasal cannula oxygen Cardiovascular status: blood pressure returned to baseline and stable Postop Assessment: no apparent nausea or vomiting Anesthetic complications: no   No notable events documented.   Last Vitals:  Vitals:   10/09/22 0803 10/09/22 0926  BP: (!) 142/91 94/71  Pulse: 98   Resp: 18   Temp: 36.5 C (!) 35.9 C  SpO2: 98%     Last Pain:  Vitals:   10/09/22 0926  TempSrc: Temporal  PainSc: Asleep                 Arita Miss

## 2022-10-09 NOTE — Op Note (Signed)
[facility] Gastroenterology Patient Name: Matthew Hartman Procedure Date: 10/09/2022 8:54 AM MRN: [id number] Account #: [id number] Date of Birth: 08/22/1976 Admit Type: Outpatient Age: 47 Room: [facility] ENDO ROOM 4 Gender: Male Note Status: Finalized Instrument Name: Jasper Riling [id number] Procedure:             Colonoscopy Indications:           Screening for colorectal malignant neoplasm Providers:             Jonathon Bellows MD, MD Referring MD:          Valerie Roys (Referring MD) Medicines:             Monitored Anesthesia Care Complications:         No immediate complications. Procedure:             Pre-Anesthesia Assessment:                        - Prior to the procedure, a History and Physical was                         performed, and patient medications, allergies and                         sensitivities were reviewed. The patient's tolerance                         of previous anesthesia was reviewed.                        - The risks and benefits of the procedure and the                         sedation options and risks were discussed with the                         patient. All questions were answered and informed                         consent was obtained.                        - ASA Grade Assessment: II - A patient with mild                         systemic disease.                        After obtaining informed consent, the colonoscope was                         passed under direct vision. Throughout the procedure,                         the patient's blood pressure, pulse, and oxygen                         saturations were monitored continuously. The                         Colonoscope was introduced  through the anus and                         advanced to the the cecum, identified by the                         appendiceal orifice. The colonoscopy was performed                         with ease. The patient tolerated the procedure well.                          The quality of the bowel preparation was good. The                         ileocecal valve, appendiceal orifice, and rectum were                         photographed. Findings:      The perianal and digital rectal examinations were normal.      Two sessile polyps were found in the ascending colon. The polyps were 4       to 5 mm in size. These polyps were removed with a cold snare. Resection       and retrieval were complete.      Three sessile polyps were found in the sigmoid colon. The polyps were 4       to 6 mm in size. These polyps were removed with a cold snare. Resection       and retrieval were complete.      The exam was otherwise without abnormality on direct and retroflexion       views. Impression:            - Two 4 to 5 mm polyps in the ascending colon, removed                         with a cold snare. Resected and retrieved.                        - Three 4 to 6 mm polyps in the sigmoid colon, removed                         with a cold snare. Resected and retrieved.                        - The examination was otherwise normal on direct and                         retroflexion views. Recommendation:        - Discharge patient to home (with escort).                        - Resume previous diet.                        - Continue present medications.                        - Await pathology results.                        -  Repeat colonoscopy for surveillance based on                         pathology results. Procedure Code(s):     --- Professional ---                        [phone], Colonoscopy, flexible; with removal of                         tumor(s), polyp(s), or other lesion(s) by snare                         technique Diagnosis Code(s):     --- Professional ---                        Z12.11, Encounter for screening for malignant neoplasm                         of colon                        D12.2, Benign neoplasm of ascending colon                         D12.5, Benign neoplasm of sigmoid colon CPT copyright 2022 American Medical Association. All rights reserved. The codes documented in this report are preliminary and upon coder review may  be revised to meet current compliance requirements. Jonathon Bellows, MD Jonathon Bellows MD, MD 10/09/2022 9:23:33 AM This report has been signed electronically. Number of Addenda: 0 Note Initiated On: 10/09/2022 8:54 AM Scope Withdrawal Time: 0 hours 12 minutes 48 seconds  Total Procedure Duration: 0 hours 14 minutes 12 seconds  Estimated Blood Loss:  Estimated blood loss: none.      [facility]

## 2022-10-09 NOTE — H&P (Signed)
     Jonathon Bellows, MD [address] [place], [place], [place], [address] [address], [place], [place], [place], [address] Phone: [phone]  Fax: [phone]  Primary Care Physician:  Valerie Roys, DO   Pre-Procedure History & Physical: HPI:  Matthew Hartman is a 47 y.o. male is here for an colonoscopy.   Past Medical History:  Diagnosis Date   Hypertension     Past Surgical History:  Procedure Laterality Date   CERVICAL SPINE SURGERY  08/11/2022   FRACTURE SURGERY Right    Has pins   KNEE SURGERY      Prior to Admission medications   Medication Sig Start Date End Date Taking? Authorizing Provider  TESTOSTERONE IM Inject into the muscle.    [provider]    Allergies as of 09/23/2022 - Review Complete 09/23/2022  Allergen Reaction Noted   Ibuprofen Itching and Swelling 06/05/2012    Family History  Problem Relation Age of Onset   Hypertension Mother    Hyperlipidemia Mother    Hypertension Father    Heart attack Father    Hyperlipidemia Father    Diabetes Sister    Heart attack Maternal Grandfather    Diabetes Maternal Grandfather    COPD Paternal Grandmother    Cancer Paternal Grandfather        Stomach   Down syndrome Son     Social History   Socioeconomic History   Marital status: Married    Spouse name: Not on file   Number of children: Not on file   Years of education: Not on file   Highest education level: Not on file  Occupational History   Not on file  Tobacco Use   Smoking status: Never   Smokeless tobacco: Never  Vaping Use   Vaping Use: Never used  Substance and Sexual Activity   Alcohol use: No   Drug use: No   Sexual activity: Yes    Birth control/protection: Surgical  Other Topics Concern   Not on file  Social History Narrative   Not on file   Social Determinants of Health   Financial Resource Strain: Not on file  Food Insecurity: Not on file  Transportation Needs: Not on file  Physical Activity:  Not on file  Stress: Not on file  Social Connections: Not on file  Intimate Partner Violence: Not on file    Review of Systems: See HPI, otherwise negative ROS  Physical Exam: BP (!) 142/91   Pulse 98   Temp 97.7 F (36.5 C) (Temporal)   Resp 18   Ht '5\' 10"'$  (1.778 m)   Wt 100.7 kg   SpO2 98%   BMI 31.85 kg/m  General:   Alert,  pleasant and cooperative in NAD Head:  Normocephalic and atraumatic. Neck:  Supple; no masses or thyromegaly. Lungs:  Clear throughout to auscultation, normal respiratory effort.    Heart:  +S1, +S2, Regular rate and rhythm, No edema. Abdomen:  Soft, nontender and nondistended. Normal bowel sounds, without guarding, and without rebound.   Neurologic:  Alert and  oriented x4;  grossly normal neurologically.  Impression/Plan: Matthew Hartman is here for an colonoscopy to be performed for Screening colonoscopy average risk   Risks, benefits, limitations, and alternatives regarding  colonoscopy have been reviewed with the patient.  Questions have been answered.  All parties agreeable.   Jonathon Bellows, MD  10/09/2022, 9:02 AM

## 2022-10-09 NOTE — Anesthesia Preprocedure Evaluation (Signed)
Anesthesia Evaluation  Patient identified by MRN, date of birth, ID band Patient awake    Reviewed: Allergy & Precautions, NPO status , Patient's Chart, lab work & pertinent test results  History of Anesthesia Complications Negative for: history of anesthetic complications  Airway Mallampati: II  TM Distance: >3 FB Neck ROM: Full    Dental no notable dental hx. (+) Teeth Intact   Pulmonary neg pulmonary ROS, neg sleep apnea, neg COPD, Patient abstained from smoking.Not current smoker   Pulmonary exam normal breath sounds clear to auscultation       Cardiovascular Exercise Tolerance: Good METS(-) hypertension(-) CAD and (-) Past MI negative cardio ROS (-) dysrhythmias  Rhythm:Regular Rate:Normal - Systolic murmurs    Neuro/Psych  Neuromuscular disease  negative psych ROS   GI/Hepatic ,neg GERD  ,,(+)     (-) substance abuse    Endo/Other  neg diabetes    Renal/GU negative Renal ROS     Musculoskeletal   Abdominal   Peds  Hematology   Anesthesia Other Findings Past Medical History: No date: Hypertension  Reproductive/Obstetrics                             Anesthesia Physical Anesthesia Plan  ASA: 2  Anesthesia Plan: General   Post-op Pain Management: Minimal or no pain anticipated   Induction: Intravenous  PONV Risk Score and Plan: 2 and Propofol infusion, TIVA and Ondansetron  Airway Management Planned: Nasal Cannula  Additional Equipment: None  Intra-op Plan:   Post-operative Plan:   Informed Consent: I have reviewed the patients History and Physical, chart, labs and discussed the procedure including the risks, benefits and alternatives for the proposed anesthesia with the patient or authorized representative who has indicated his/her understanding and acceptance.     Dental advisory given  Plan Discussed with: CRNA and Surgeon  Anesthesia Plan Comments: (Discussed  risks of anesthesia with patient, including possibility of difficulty with spontaneous ventilation under anesthesia necessitating airway intervention, PONV, and rare risks such as cardiac or respiratory or neurological events, and allergic reactions. Discussed the role of CRNA in patient's perioperative care. Patient understands.)       Anesthesia Quick Evaluation

## 2022-10-09 NOTE — Transfer of Care (Signed)
Immediate Anesthesia Transfer of Care Note  Patient: Matthew Hartman  Procedure(s) Performed: COLONOSCOPY WITH PROPOFOL  Patient Location: Endoscopy Unit  Anesthesia Type:General  Level of Consciousness: drowsy  Airway & Oxygen Therapy: Patient Spontanous Breathing  Post-op Assessment: Report given to RN and Post -op Vital signs reviewed and stable  Post vital signs: Reviewed and stable  Last Vitals:  Vitals Value Taken Time  BP 94/71 10/09/22 0927  Temp 35.9 C 10/09/22 0926  Pulse 79 10/09/22 0929  Resp 15 10/09/22 0929  SpO2 93 % 10/09/22 0929  Vitals shown include unvalidated device data.  Last Pain:  Vitals:   10/09/22 0926  TempSrc: Temporal  PainSc: Asleep         Complications: No notable events documented.

## 2022-10-10 ENCOUNTER — Encounter: Payer: Self-pay | Admitting: Gastroenterology

## 2022-10-10 LAB — SURGICAL PATHOLOGY

## 2022-10-13 ENCOUNTER — Encounter: Payer: Self-pay | Admitting: Gastroenterology

## 2022-10-15 ENCOUNTER — Encounter: Payer: Self-pay | Admitting: Family Medicine

## 2022-10-21 ENCOUNTER — Encounter: Payer: Self-pay | Admitting: Family Medicine

## 2022-11-25 ENCOUNTER — Encounter: Payer: Self-pay | Admitting: Family Medicine

## 2022-11-25 ENCOUNTER — Ambulatory Visit (INDEPENDENT_AMBULATORY_CARE_PROVIDER_SITE_OTHER): Payer: BC Managed Care – PPO | Admitting: Family Medicine

## 2022-11-25 VITALS — BP 141/98 | HR 71 | Temp 98.1°F | Ht 70.0 in | Wt 233.1 lb

## 2022-11-25 DIAGNOSIS — E291 Testicular hypofunction: Secondary | ICD-10-CM | POA: Diagnosis not present

## 2022-11-25 MED ORDER — GLUTATHIONE 500 MG PO CAPS: 500.0000 mg | ORAL_CAPSULE | Freq: Every day | ORAL | 3 refills | Status: DC

## 2022-11-25 MED ORDER — GLUTATHIONE 6 GM/30ML IJ SOLN: 100.0000 mg | INTRAMUSCULAR | 0 refills | Status: DC

## 2022-11-25 MED ORDER — TESTOSTERONE CYPIONATE 200 MG/ML IM SOLN: 120.0000 mg | INTRAMUSCULAR | 2 refills | Status: DC

## 2022-11-25 NOTE — Progress Notes (Signed)
BP (!) 141/98 (BP Location: Left Arm, Cuff Size: Normal)   Pulse 71   Temp 98.1 F (36.7 C) (Oral)   Ht 5\' 10"  (1.778 m)   Wt 233 lb 1.6 oz (105.7 kg)   SpO2 98%   BMI 33.45 kg/m    Subjective:    Patient ID: Matthew Hartman, male    DOB: 01/02/1976, 47 y.o.   MRN: [id number]  HPI: Matthew Hartman is a 47 y.o. male  Chief Complaint  Patient presents with   Low testosterone   LOW TESTOSTERONE- has been getting testosterone IM through an online program with regular lab work, he would like to switch to [place] prescribing it Duration: about a year Status: controlled  Satisfied with current treatment:  yes Previous testosterone therapies: none Medication side effects:  no Medication compliance: excellent compliance Decreased libido: no Fatigue: no Depressed mood: no Muscle weakness: no Erectile dysfunction: no  Relevant past medical, surgical, family and social history reviewed and updated as indicated. Interim medical history since our last visit reviewed. Allergies and medications reviewed and updated.  Review of Systems  Constitutional: Negative.   Respiratory: Negative.    Cardiovascular: Negative.   Gastrointestinal: Negative.   Musculoskeletal: Negative.   Psychiatric/Behavioral: Negative.      Per HPI unless specifically indicated above     Objective:    BP (!) 141/98 (BP Location: Left Arm, Cuff Size: Normal)   Pulse 71   Temp 98.1 F (36.7 C) (Oral)   Ht 5\' 10"  (1.778 m)   Wt 233 lb 1.6 oz (105.7 kg)   SpO2 98%   BMI 33.45 kg/m   Wt Readings from Last 3 Encounters:  11/25/22 233 lb 1.6 oz (105.7 kg)  10/09/22 222 lb (100.7 kg)  09/16/22 226 lb 3.2 oz (102.6 kg)    Physical Exam Vitals and nursing note reviewed.  Constitutional:      General: He is not in acute distress.    Appearance: Normal appearance. He is not ill-appearing, toxic-appearing or diaphoretic.  HENT:     Head: Normocephalic and atraumatic.     Right Ear: External ear  normal.     Left Ear: External ear normal.     Nose: Nose normal.     Mouth/Throat:     Mouth: Mucous membranes are moist.     Pharynx: Oropharynx is clear.  Eyes:     General: No scleral icterus.       Right eye: No discharge.        Left eye: No discharge.     Extraocular Movements: Extraocular movements intact.     Conjunctiva/sclera: Conjunctivae normal.     Pupils: Pupils are equal, round, and reactive to light.  Cardiovascular:     Rate and Rhythm: Normal rate and regular rhythm.     Pulses: Normal pulses.     Heart sounds: Normal heart sounds. No murmur heard.    No friction rub. No gallop.  Pulmonary:     Effort: Pulmonary effort is normal. No respiratory distress.     Breath sounds: Normal breath sounds. No stridor. No wheezing, rhonchi or rales.  Chest:     Chest wall: No tenderness.  Musculoskeletal:        General: Normal range of motion.     Cervical back: Normal range of motion and neck supple.  Skin:    General: Skin is warm and dry.     Capillary Refill: Capillary refill takes less than 2 seconds.  Coloration: Skin is not jaundiced or pale.     Findings: No bruising, erythema, lesion or rash.  Neurological:     General: No focal deficit present.     Mental Status: He is alert and oriented to person, place, and time. Mental status is at baseline.  Psychiatric:        Mood and Affect: Mood normal.        Behavior: Behavior normal.        Thought Content: Thought content normal.        Judgment: Judgment normal.     Results for orders placed or performed during the hospital encounter of 10/09/22  Surgical pathology  Result Value Ref Range   SURGICAL PATHOLOGY      SURGICAL PATHOLOGY CASE: [id number] PATIENT: [name] Surgical Pathology Report     Specimen Submitted: A. Colon polyp x2, ascending; cold snare B. Colon polyp x3, sigmoid; cold snare  Clinical History: Screening colonoscopy.  Polyps      DIAGNOSIS: A.  COLON POLYP X  2, ASCENDING; COLD SNARE: - TUBULAR ADENOMA (2). - NEGATIVE FOR DYSPLASIA AND MALIGNANCY.  B.  COLON POLYP X 3, SIGMOID; COLD SNARE: - TUBULAR ADENOMA (2). - HYPERPLASTIC POLYP (1). - NEGATIVE FOR HIGH-GRADE DYSPLASIA AND MALIGNANCY.  GROSS DESCRIPTION: A. Labeled: Cold snare ascending colon polyp x 2 Received: Formalin Collection time: 9:14 AM on 10/09/2022 Placed into formalin time: 9:14 AM on 10/09/2022 Tissue fragment(s): Multiple Size: Aggregate, 0.9 x 0.5 x 0.1 cm Description: Received are at least 2 fragments of tan-pink soft tissue admixed with intestinal debris.  The ratio of soft tissue to intestinal debris is 60: 40. Entirely submitted in 1 cassette.  B. Labeled: Col d snare sigmoid colon polyps x 3 Received: Formalin Collection time: 9:20 AM on 10/09/2022 Placed into formalin time: 9:20 AM on 10/09/2022 Tissue fragment(s): Multiple Size: Aggregate, 1.1 x 0.8 x 0.2 cm Description: Received are fragments of tan-pink soft tissue admixed with intestinal debris.  The ratio of soft tissue to intestinal debris is 90: 10. Entirely submitted in 1 cassette.  RB 10/09/2022  Final Diagnosis performed by Quay Burow, MD.   Electronically signed 10/10/2022 2:26:39PM The electronic signature indicates that the named Attending Pathologist has evaluated the specimen Technical component performed at [facility], [address], [place], [place] [address] Lab: [phone] Dir: Rush Farmer, MD, MMM  Professional component performed at [facility], [facility], [place], [place], [place] [address] Lab: [phone] Dir: Kathi Simpers, MD       Assessment & Plan:   Problem List Items Addressed This Visit       Endocrine   Hypogonadism in male - Primary    Will continue his testosterone. Labs drawn today. Continue to monitor. Will get recheck on labs in about a month. Call with any concerns.       Relevant Orders   Testosterone, free, total([facility])    PSA   Testosterone, free, total([facility])     Follow up plan: Return in about 6 months (around 05/28/2023).

## 2022-11-25 NOTE — Assessment & Plan Note (Addendum)
Will continue his testosterone. Labs drawn today. Continue to monitor. Will get recheck on labs in about a month. Call with any concerns.

## 2022-11-26 ENCOUNTER — Other Ambulatory Visit: Payer: Self-pay | Admitting: Family Medicine

## 2022-11-26 LAB — TESTOSTERONE, FREE, TOTAL, SHBG
Sex Hormone Binding: 16.4 nmol/L — ABNORMAL LOW (ref 16.5–55.9)
Testosterone, Free: 38.2 pg/mL — ABNORMAL HIGH (ref 6.8–21.5)
Testosterone: 1041 ng/dL — ABNORMAL HIGH (ref 264–916)

## 2022-11-26 LAB — PSA: Prostate Specific Ag, Serum: 1 ng/mL (ref 0.0–4.0)

## 2022-11-26 MED ORDER — TESTOSTERONE CYPIONATE 200 MG/ML IM SOLN: INTRAMUSCULAR | 2 refills | Status: DC

## 2022-11-27 ENCOUNTER — Encounter: Payer: Self-pay | Admitting: Family Medicine

## 2022-12-26 ENCOUNTER — Other Ambulatory Visit: Payer: BC Managed Care – PPO

## 2022-12-26 DIAGNOSIS — E291 Testicular hypofunction: Secondary | ICD-10-CM

## 2022-12-31 LAB — TESTOSTERONE, FREE, TOTAL, SHBG
Sex Hormone Binding: 21.2 nmol/L (ref 16.5–55.9)
Testosterone, Free: 13.3 pg/mL (ref 6.8–21.5)
Testosterone: 530 ng/dL (ref 264–916)

## 2023-05-19 DIAGNOSIS — M5416 Radiculopathy, lumbar region: Secondary | ICD-10-CM | POA: Insufficient documentation

## 2023-05-28 ENCOUNTER — Ambulatory Visit: Payer: BC Managed Care – PPO | Admitting: Family Medicine

## 2023-06-02 DIAGNOSIS — M7918 Myalgia, other site: Secondary | ICD-10-CM | POA: Diagnosis not present

## 2023-06-02 DIAGNOSIS — M9904 Segmental and somatic dysfunction of sacral region: Secondary | ICD-10-CM | POA: Diagnosis not present

## 2023-06-02 DIAGNOSIS — M5442 Lumbago with sciatica, left side: Secondary | ICD-10-CM | POA: Diagnosis not present

## 2023-06-02 DIAGNOSIS — M9903 Segmental and somatic dysfunction of lumbar region: Secondary | ICD-10-CM | POA: Diagnosis not present

## 2023-06-10 DIAGNOSIS — M5416 Radiculopathy, lumbar region: Secondary | ICD-10-CM | POA: Diagnosis not present

## 2023-08-02 ENCOUNTER — Ambulatory Visit
Admission: EM | Admit: 2023-08-02 | Discharge: 2023-08-02 | Disposition: A | Discharge status: Home / Self Care | Payer: BC Managed Care – PPO | Attending: Emergency Medicine | Admitting: Emergency Medicine

## 2023-08-02 ENCOUNTER — Encounter: Payer: Self-pay | Admitting: *Deleted

## 2023-08-02 DIAGNOSIS — J069 Acute upper respiratory infection, unspecified: Secondary | ICD-10-CM | POA: Diagnosis not present

## 2023-08-02 MED ORDER — BENZONATATE 100 MG PO CAPS: 100.0000 mg | ORAL_CAPSULE | Freq: Three times a day (TID) | ORAL | 0 refills | Status: DC

## 2023-08-02 MED ORDER — PROMETHAZINE-DM 6.25-15 MG/5ML PO SYRP: 5.0000 mL | ORAL_SOLUTION | Freq: Every evening | ORAL | 0 refills | Status: DC | PRN

## 2023-08-02 MED ORDER — AZITHROMYCIN 250 MG PO TABS: 250.0000 mg | ORAL_TABLET | Freq: Every day | ORAL | 0 refills | Status: DC

## 2023-08-02 NOTE — ED Provider Notes (Signed)
Renaldo Fiddler    CSN: [id number] Arrival date & time: 08/02/23  0815      History   Chief Complaint Chief Complaint  Patient presents with   Cough   Fever    HPI Matthew Hartman is a 47 y.o. male.   Patient presents for evaluation of fever peaking at 103, chills, headaches and productive cough with green sputum.  Intermittently experiencing nasal congestion and a dry throat.  Known sick contacts within household as he has 7 children, endorses they have required various antibiotics to resolve symptoms.  Decreased appetite due to nausea but able to tolerate some food and liquids.  Has attempted use of Aleve.  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   Hypogonadism in male 08/19/2022   Cervical radiculopathy 08/19/2022   HTN (hypertension) 07/21/2019   HLD (hyperlipidemia) 07/21/2019   History of kidney stones 07/21/2019    Past Surgical History:  Procedure Laterality Date   CERVICAL SPINE SURGERY  08/11/2022   COLONOSCOPY WITH PROPOFOL N/A 10/09/2022   Procedure: COLONOSCOPY WITH PROPOFOL;  Surgeon: Wyline Mood, MD;  Location: [facility] ENDOSCOPY;  Service: Gastroenterology;  Laterality: N/A;   FRACTURE SURGERY Right    Has pins   KNEE SURGERY         Home Medications    Prior to Admission medications   Medication Sig Start Date End Date Taking? Authorizing Provider  azithromycin (ZITHROMAX) 250 MG tablet Take 1 tablet (250 mg total) by mouth daily. Take first 2 tablets together, then 1 every day until finished. 08/02/23  Yes Meron Bocchino R, NP  benzonatate (TESSALON) 100 MG capsule Take 1 capsule (100 mg total) by  mouth every 8 (eight) hours. 08/02/23  Yes Keonta Monceaux R, NP  promethazine-dextromethorphan (PROMETHAZINE-DM) 6.25-15 MG/5ML syrup Take 5 mLs by mouth at bedtime as needed for cough. 08/02/23  Yes Neleh Muldoon R, NP  Glutathione 6 GM/30ML SOLN Inject 100 mg into the skin 2 (two) times a week. 11/27/22   Olevia Perches P, DO  testosterone cypionate (DEPOTESTOSTERONE CYPIONATE) 200 MG/ML injection Take 100mg  1x a week and 120mg  1x a week (220mg  total a week) 11/27/22   Dorcas Carrow, DO    Family History Family History  Problem Relation Age of Onset   Hypertension Mother    Hyperlipidemia Mother    Hypertension Father    Heart attack Father    Hyperlipidemia Father    Diabetes Sister    Heart attack Maternal Grandfather    Diabetes Maternal Grandfather    COPD Paternal Grandmother    Cancer Paternal Grandfather        Stomach   Down syndrome Son     Social History Social History   Tobacco Use   Smoking status: Never   Smokeless tobacco: Never  Vaping Use   Vaping status: Never Used  Substance Use Topics   Alcohol use: No   Drug use: No     Allergies   Ibuprofen   Review of Systems Review of Systems   Physical Exam Triage Vital Signs ED Triage Vitals  Encounter Vitals Group     BP 08/02/23 0834 123/84  Systolic BP Percentile --      Diastolic BP Percentile --      Pulse Rate 08/02/23 0834 79     Resp 08/02/23 0834 20     Temp 08/02/23 0834 99 F (37.2 C)     Temp Source 08/02/23 0834 Oral     SpO2 08/02/23 0834 97 %     Weight 08/02/23 0832 220 lb (99.8 kg)     Height 08/02/23 0832 5\' 10"  (1.778 m)     Head Circumference --      Peak Flow --      Pain Score 08/02/23 0832 5     Pain Loc --      Pain Education --      Exclude from Growth Chart --    No data found.  Updated Vital Signs BP 123/84 (BP Location: Left Arm)   Pulse 79   Temp 99 F (37.2 C) (Oral)   Resp 20   Ht 5\' 10"  (1.778 m)   Wt 220 lb (99.8 kg)   SpO2 97%   BMI 31.57  kg/m   Visual Acuity Right Eye Distance:   Left Eye Distance:   Bilateral Distance:    Right Eye Near:   Left Eye Near:    Bilateral Near:     Physical Exam Constitutional:      Appearance: Normal appearance.  HENT:     Right Ear: Tympanic membrane, ear canal and external ear normal.     Left Ear: Tympanic membrane, ear canal and external ear normal.     Nose: No congestion or rhinorrhea.     Mouth/Throat:     Mouth: Mucous membranes are moist.     Pharynx: Oropharynx is clear. Posterior oropharyngeal erythema present. No oropharyngeal exudate.  Eyes:     Extraocular Movements: Extraocular movements intact.  Cardiovascular:     Rate and Rhythm: Normal rate and regular rhythm.     Pulses: Normal pulses.     Heart sounds: Normal heart sounds.  Pulmonary:     Effort: Pulmonary effort is normal.     Breath sounds: Normal breath sounds.  Neurological:     Mental Status: He is alert and oriented to person, place, and time. Mental status is at baseline.      UC Treatments / Results  Labs (all labs ordered are listed, but only abnormal results are displayed) Labs Reviewed - No data to display  EKG   Radiology No results found.  Procedures Procedures (including critical care time)  Medications Ordered in UC Medications - No data to display  Initial Impression / Assessment and Plan / UC Course  I have reviewed the triage vital signs and the nursing notes.  Pertinent labs & imaging results that were available during my care of the patient were reviewed by me and considered in my medical decision making (see chart for details).  Viral URI with cough  Patient is in no signs of distress nor toxic appearing.  Vital signs are stable.  Low suspicion for pneumonia, pneumothorax or bronchitis and therefore will defer imaging.  Prophylactically placed on azithromycin, additionally prescribed Tessalon and Promethazine DM for management of cough.May use additional  over-the-counter medications as needed for supportive care.  May follow-up with urgent care as needed if symptoms persist or worsen.  Final Clinical Impressions(s) / UC Diagnoses   Final diagnoses:  Viral URI with cough     Discharge Instructions      Your symptoms today are most likely being  caused by a virus and should steadily improve in time it can take up to 7 to 10 days before you truly start to see a turnaround however things will get better your children have required antibiotics to feel better we will initiate treatment if no bacteria is present and this will be more of a protective factor to keep symptoms from worsening, begin azithromycin as directed  You may use Tessalon pill every 8 hours as needed to help calm your cough  You may use cough syrup at bedtime for additional comfort to allow you to rest    You can take Tylenol and/or Ibuprofen as needed for fever reduction and pain relief.   For cough: honey 1/2 to 1 teaspoon (you can dilute the honey in water or another fluid).  You can also use guaifenesin and dextromethorphan for cough. You can use a humidifier for chest congestion and cough.  If you don't have a humidifier, you can sit in the bathroom with the hot shower running.      For sore throat: try warm salt water gargles, cepacol lozenges, throat spray, warm tea or water with lemon/honey, popsicles or ice, or OTC cold relief medicine for throat discomfort.   For congestion: take a daily anti-histamine like Zyrtec, Claritin, and a oral decongestant, such as pseudoephedrine.  You can also use Flonase 1-2 sprays in each nostril daily.   It is important to stay hydrated: drink plenty of fluids (water, gatorade/powerade/pedialyte, juices, or teas) to keep your throat moisturized and help further relieve irritation/discomfort.    ED Prescriptions     Medication Sig Dispense Auth. Provider   azithromycin (ZITHROMAX) 250 MG tablet Take 1 tablet (250 mg total) by mouth  daily. Take first 2 tablets together, then 1 every day until finished. 6 tablet Keen Ewalt R, NP   benzonatate (TESSALON) 100 MG capsule Take 1 capsule (100 mg total) by mouth every 8 (eight) hours. 21 capsule Crawford Tamura R, NP   promethazine-dextromethorphan (PROMETHAZINE-DM) 6.25-15 MG/5ML syrup Take 5 mLs by mouth at bedtime as needed for cough. 118 mL Eris Hannan, Elita Boone, NP      PDMP not reviewed this encounter.   Valinda Hoar, NP 08/02/23 [phone]

## 2023-08-02 NOTE — ED Triage Notes (Signed)
Patient states cough with green phlegm, tmax 103 oral for 2-3 days, chills, headache.  Taking Aleve, nothing for cough

## 2023-08-02 NOTE — Discharge Instructions (Signed)
Your symptoms today are most likely being caused by a virus and should steadily improve in time it can take up to 7 to 10 days before you truly start to see a turnaround however things will get better your children have required antibiotics to feel better we will initiate treatment if no bacteria is present and this will be more of a protective factor to keep symptoms from worsening, begin azithromycin as directed  You may use Tessalon pill every 8 hours as needed to help calm your cough  You may use cough syrup at bedtime for additional comfort to allow you to rest    You can take Tylenol and/or Ibuprofen as needed for fever reduction and pain relief.   For cough: honey 1/2 to 1 teaspoon (you can dilute the honey in water or another fluid).  You can also use guaifenesin and dextromethorphan for cough. You can use a humidifier for chest congestion and cough.  If you don't have a humidifier, you can sit in the bathroom with the hot shower running.      For sore throat: try warm salt water gargles, cepacol lozenges, throat spray, warm tea or water with lemon/honey, popsicles or ice, or OTC cold relief medicine for throat discomfort.   For congestion: take a daily anti-histamine like Zyrtec, Claritin, and a oral decongestant, such as pseudoephedrine.  You can also use Flonase 1-2 sprays in each nostril daily.   It is important to stay hydrated: drink plenty of fluids (water, gatorade/powerade/pedialyte, juices, or teas) to keep your throat moisturized and help further relieve irritation/discomfort.

## 2023-08-13 ENCOUNTER — Ambulatory Visit (INDEPENDENT_AMBULATORY_CARE_PROVIDER_SITE_OTHER): Payer: BC Managed Care – PPO | Admitting: Family Medicine

## 2023-08-13 ENCOUNTER — Encounter: Payer: Self-pay | Admitting: Family Medicine

## 2023-08-13 VITALS — BP 138/84 | HR 74 | Ht 70.0 in | Wt 226.0 lb

## 2023-08-13 DIAGNOSIS — E291 Testicular hypofunction: Secondary | ICD-10-CM

## 2023-08-13 NOTE — Assessment & Plan Note (Signed)
Has been off his testosterone for about 6 months. Will recheck labs and get him restarted on his testosterone. Call with any concerns. Continue to monitor.

## 2023-08-13 NOTE — Progress Notes (Signed)
BP 138/84   Pulse 74   Ht 5\' 10"  (1.778 m)   Wt 226 lb (102.5 kg)   SpO2 98%   BMI 32.43 kg/m    Subjective:    Patient ID: Matthew Hartman, male    DOB: 17-Feb-1976, 47 y.o.   MRN: [id number]  HPI: AAMARI Hartman is a 47 y.o. male  Chief Complaint  Patient presents with   Fatigue   Testosterone Check   LOW TESTOSTERONE- has been off his testosterone since 6+ months Duration: chronic Status: uncontrolled  Satisfied with current treatment:  no Previous testosterone therapies: IM testosterone Medication side effects:  no Medication compliance:  has been off his meds for about 6 months Decreased libido: yes Fatigue: yes Depressed mood: no Muscle weakness: yes Erectile dysfunction: yes  Relevant past medical, surgical, family and social history reviewed and updated as indicated. Interim medical history since our last visit reviewed. Allergies and medications reviewed and updated.  Review of Systems  Constitutional: Negative.   Respiratory: Negative.    Cardiovascular: Negative.   Genitourinary: Negative.   Musculoskeletal:  Positive for myalgias. Negative for arthralgias, back pain, gait problem, joint swelling, neck pain and neck stiffness.  Skin: Negative.   Neurological:  Positive for weakness. Negative for dizziness, tremors, seizures, syncope, facial asymmetry, speech difficulty, light-headedness, numbness and headaches.  Psychiatric/Behavioral: Negative.      Per HPI unless specifically indicated above     Objective:    BP 138/84   Pulse 74   Ht 5\' 10"  (1.778 m)   Wt 226 lb (102.5 kg)   SpO2 98%   BMI 32.43 kg/m   Wt Readings from Last 3 Encounters:  08/13/23 226 lb (102.5 kg)  08/02/23 220 lb (99.8 kg)  11/25/22 233 lb 1.6 oz (105.7 kg)    Physical Exam Vitals and nursing note reviewed.  Constitutional:      General: He is not in acute distress.    Appearance: Normal appearance. He is obese. He is not ill-appearing, toxic-appearing or  diaphoretic.  HENT:     Head: Normocephalic and atraumatic.     Right Ear: External ear normal.     Left Ear: External ear normal.     Nose: Nose normal.     Mouth/Throat:     Mouth: Mucous membranes are moist.     Pharynx: Oropharynx is clear.  Eyes:     General: No scleral icterus.       Right eye: No discharge.        Left eye: No discharge.     Extraocular Movements: Extraocular movements intact.     Conjunctiva/sclera: Conjunctivae normal.     Pupils: Pupils are equal, round, and reactive to light.  Cardiovascular:     Rate and Rhythm: Normal rate and regular rhythm.     Pulses: Normal pulses.     Heart sounds: Normal heart sounds. No murmur heard.    No friction rub. No gallop.  Pulmonary:     Effort: Pulmonary effort is normal. No respiratory distress.     Breath sounds: Normal breath sounds. No stridor. No wheezing, rhonchi or rales.  Chest:     Chest wall: No tenderness.  Musculoskeletal:        General: Normal range of motion.     Cervical back: Normal range of motion and neck supple.  Skin:    General: Skin is warm and dry.     Capillary Refill: Capillary refill takes less than 2 seconds.  Coloration: Skin is not jaundiced or pale.     Findings: No bruising, erythema, lesion or rash.  Neurological:     General: No focal deficit present.     Mental Status: He is alert and oriented to person, place, and time. Mental status is at baseline.  Psychiatric:        Mood and Affect: Mood normal.        Behavior: Behavior normal.        Thought Content: Thought content normal.        Judgment: Judgment normal.     Results for orders placed or performed in visit on 12/26/22  Testosterone, free, total([facility])  Result Value Ref Range   Testosterone 530 264 - 916 ng/dL   Testosterone, Free 91.4 6.8 - 21.5 pg/mL   Sex Hormone Binding 21.2 16.5 - 55.9 nmol/L      Assessment & Plan:   Problem List Items Addressed This Visit       Endocrine    Hypogonadism in male - Primary    Has been off his testosterone for about 6 months. Will recheck labs and get him restarted on his testosterone. Call with any concerns. Continue to monitor.       Relevant Orders   PSA   Testosterone, free, total([facility])     Follow up plan: Return in about 6 weeks (around 09/24/2023) for physical.

## 2023-08-13 NOTE — Telephone Encounter (Signed)
Called patient and he already had an appointment scheduled for today @ 3:20 pm.

## 2023-08-14 ENCOUNTER — Other Ambulatory Visit: Payer: BC Managed Care – PPO

## 2023-08-18 ENCOUNTER — Other Ambulatory Visit: Payer: BC Managed Care – PPO

## 2023-08-18 DIAGNOSIS — E291 Testicular hypofunction: Secondary | ICD-10-CM | POA: Diagnosis not present

## 2023-08-19 LAB — TESTOSTERONE, FREE, TOTAL, SHBG
Sex Hormone Binding: 31.7 nmol/L (ref 16.5–55.9)
Testosterone, Free: 9 pg/mL (ref 6.8–21.5)
Testosterone: 252 ng/dL — ABNORMAL LOW (ref 264–916)

## 2023-08-19 LAB — PSA: Prostate Specific Ag, Serum: 0.8 ng/mL (ref 0.0–4.0)

## 2023-08-24 ENCOUNTER — Other Ambulatory Visit: Payer: Self-pay | Admitting: Family Medicine

## 2023-08-24 MED ORDER — TESTOSTERONE CYPIONATE 200 MG/ML IM SOLN: INTRAMUSCULAR | 0 refills | Status: DC

## 2023-08-24 MED ORDER — "INSULIN SYRINGE 28G X 1/2"" 0.5 ML MISC": 1.0000 | 1 refills | Status: AC

## 2023-09-08 ENCOUNTER — Encounter: Payer: Self-pay | Admitting: Family Medicine

## 2023-09-18 ENCOUNTER — Ambulatory Visit (INDEPENDENT_AMBULATORY_CARE_PROVIDER_SITE_OTHER): Payer: BC Managed Care – PPO | Admitting: Family Medicine

## 2023-09-18 ENCOUNTER — Encounter: Payer: Self-pay | Admitting: Family Medicine

## 2023-09-18 VITALS — BP 122/90 | HR 64 | Ht 71.0 in | Wt 218.0 lb

## 2023-09-18 DIAGNOSIS — E291 Testicular hypofunction: Secondary | ICD-10-CM

## 2023-09-18 DIAGNOSIS — I10 Essential (primary) hypertension: Secondary | ICD-10-CM | POA: Diagnosis not present

## 2023-09-18 DIAGNOSIS — E782 Mixed hyperlipidemia: Secondary | ICD-10-CM

## 2023-09-18 DIAGNOSIS — Z0000 Encounter for general adult medical examination without abnormal findings: Secondary | ICD-10-CM

## 2023-09-18 LAB — MICROALBUMIN, URINE WAIVED
Creatinine, Urine Waived: 50 mg/dL (ref 10–300)
Microalb, Ur Waived: 10 mg/L (ref 0–19)

## 2023-09-18 NOTE — Progress Notes (Signed)
 BP (!) 122/90   Pulse 64   Ht 5' 11 (1.803 m)   Wt 218 lb (98.9 kg)   SpO2 97%   BMI 30.40 kg/m    Subjective:    Patient ID: Matthew Hartman, male    DOB: November 02, 1975, 48 y.o.   MRN: [id number]  HPI: Matthew Hartman is a 48 y.o. male presenting on 09/18/2023 for comprehensive medical examination. Current medical complaints include:  HYPERTENSION / HYPERLIPIDEMIA Satisfied with current treatment? yes Duration of hypertension: chronic BP monitoring frequency: rarely BP medication side effects: no Past BP meds: none Duration of hyperlipidemia: chronic Cholesterol medication side effects: no Cholesterol supplements: none Past cholesterol medications: none Medication compliance: N/A Aspirin: no Recent stressors: no Recurrent headaches: no Visual changes: no Palpitations: no Dyspnea: no Chest pain: no Lower extremity edema: no Dizzy/lightheaded: no  LOW TESTOSTERONE  Duration: chronic Status: controlled  Satisfied with current treatment:  yes Previous testosterone  therapies: injectables Medication side effects:  no Medication compliance: excellent compliance Decreased libido: yes Fatigue: yes Depressed mood: yes Muscle weakness: yes Erectile dysfunction: yes  He currently lives with: wife and kids Interim Problems from his last visit: no  Depression Screen done today and results listed below:     09/18/2023    9:55 AM 08/13/2023    3:45 PM 11/25/2022    9:30 AM 08/19/2022    9:06 AM 01/06/2020    8:29 AM  Depression screen PHQ 2/9  Decreased Interest 0 1 0 0 0  Down, Depressed, Hopeless 0 0 0 0 0  PHQ - 2 Score 0 1 0 0 0  Altered sleeping 0 0 0 0 0  Tired, decreased energy 0 2 0 0 0  Change in appetite 0 0 0 0 0  Feeling bad or failure about yourself  0 0 0 0 0  Trouble concentrating 0 0 0 0 0  Moving slowly or fidgety/restless 0 0 0 0 0  Suicidal thoughts 0 0 0 0 0  PHQ-9 Score 0 3 0 0 0  Difficult doing work/chores Not difficult at all Not  difficult at all  Not difficult at all Not difficult at all    Past Medical History:  Past Medical History:  Diagnosis Date   Hypertension     Surgical History:  Past Surgical History:  Procedure Laterality Date   CERVICAL SPINE SURGERY  08/11/2022   COLONOSCOPY WITH PROPOFOL  N/A 10/09/2022   Procedure: COLONOSCOPY WITH PROPOFOL ;  Surgeon: Therisa Bi, MD;  Location: [facility] ENDOSCOPY;  Service: Gastroenterology;  Laterality: N/A;   FRACTURE SURGERY Right    Has pins   KNEE SURGERY      Medications:  Current Outpatient Medications on File Prior to Visit  Medication Sig   Insulin  Syringe-Needle U-100 (INSULIN  SYRINGE .5CC/28G) 28G X 1/2 0.5 ML MISC 1 each by Does not apply route every 14 (fourteen) days.   testosterone  cypionate (DEPOTESTOSTERONE CYPIONATE) 200 MG/ML injection Take 100mg  1x a week and 120mg  1x a week (220mg  total a week)   No current facility-administered medications on file prior to visit.    Allergies:  Allergies  Allergen Reactions   Ibuprofen  Itching and Swelling    Social History:  Social History   Socioeconomic History   Marital status: Married    Spouse name: Not on file   Number of children: Not on file   Years of education: Not on file   Highest education level: Not on file  Occupational History   Not on file  Tobacco Use   Smoking status: Never   Smokeless tobacco: Never  Vaping Use   Vaping status: Never Used  Substance and Sexual Activity   Alcohol use: No   Drug use: No   Sexual activity: Yes    Birth control/protection: Surgical  Other Topics Concern   Not on file  Social History Narrative   Not on file   Social Drivers of Health   Financial Resource Strain: Not on file  Food Insecurity: Not on file  Transportation Needs: Not on file  Physical Activity: Not on file  Stress: Not on file  Social Connections: Not on file  Intimate Partner Violence: Not on file   Social History   Tobacco Use  Smoking Status Never   Smokeless Tobacco Never   Social History   Substance and Sexual Activity  Alcohol Use No    Family History:  Family History  Problem Relation Age of Onset   Hypertension Mother    Hyperlipidemia Mother    Hypertension Father    Heart attack Father    Hyperlipidemia Father    Diabetes Sister    Heart attack Maternal Grandfather    Diabetes Maternal Grandfather    COPD Paternal Grandmother    Cancer Paternal Grandfather        Stomach   Down syndrome Son     Past medical history, surgical history, medications, allergies, family history and social history reviewed with patient today and changes made to appropriate areas of the chart.   Review of Systems  Constitutional: Negative.   HENT:  Positive for tinnitus. Negative for congestion, ear discharge, ear pain, hearing loss, nosebleeds, sinus pain and sore throat.   Eyes:  Positive for blurred vision. Negative for double vision, photophobia, pain, discharge and redness.  Respiratory: Negative.  Negative for stridor.   Cardiovascular: Negative.   Gastrointestinal: Negative.   Genitourinary: Negative.   Musculoskeletal:  Positive for back pain, myalgias and neck pain. Negative for falls and joint pain.  Neurological: Negative.   Endo/Heme/Allergies: Negative.   Psychiatric/Behavioral:  Positive for depression. Negative for hallucinations, memory loss, substance abuse and suicidal ideas. The patient is not nervous/anxious and does not have insomnia.    All other ROS negative except what is listed above and in the HPI.      Objective:    BP (!) 122/90   Pulse 64   Ht 5' 11 (1.803 m)   Wt 218 lb (98.9 kg)   SpO2 97%   BMI 30.40 kg/m   Wt Readings from Last 3 Encounters:  09/18/23 218 lb (98.9 kg)  08/13/23 226 lb (102.5 kg)  08/02/23 220 lb (99.8 kg)    Physical Exam Vitals and nursing note reviewed.  Constitutional:      General: He is not in acute distress.    Appearance: Normal appearance. He is not  ill-appearing, toxic-appearing or diaphoretic.  HENT:     Head: Normocephalic and atraumatic.     Right Ear: Tympanic membrane, ear canal and external ear normal. There is no impacted cerumen.     Left Ear: Tympanic membrane, ear canal and external ear normal. There is no impacted cerumen.     Nose: Nose normal. No congestion or rhinorrhea.     Mouth/Throat:     Mouth: Mucous membranes are moist.     Pharynx: Oropharynx is clear. No oropharyngeal exudate or posterior oropharyngeal erythema.  Eyes:     General: No scleral icterus.       Right eye:  No discharge.        Left eye: No discharge.     Extraocular Movements: Extraocular movements intact.     Conjunctiva/sclera: Conjunctivae normal.     Pupils: Pupils are equal, round, and reactive to light.  Neck:     Vascular: No carotid bruit.  Cardiovascular:     Rate and Rhythm: Normal rate and regular rhythm.     Pulses: Normal pulses.     Heart sounds: No murmur heard.    No friction rub. No gallop.  Pulmonary:     Effort: Pulmonary effort is normal. No respiratory distress.     Breath sounds: Normal breath sounds. No stridor. No wheezing, rhonchi or rales.  Chest:     Chest wall: No tenderness.  Abdominal:     General: Abdomen is flat. Bowel sounds are normal. There is no distension.     Palpations: Abdomen is soft. There is no mass.     Tenderness: There is no abdominal tenderness. There is no right CVA tenderness, left CVA tenderness, guarding or rebound.     Hernia: No hernia is present.  Genitourinary:    Comments: Genital exam deferred with shared decision making Musculoskeletal:        General: No swelling, tenderness, deformity or signs of injury.     Cervical back: Normal range of motion and neck supple. No rigidity. No muscular tenderness.     Right lower leg: No edema.     Left lower leg: No edema.  Lymphadenopathy:     Cervical: No cervical adenopathy.  Skin:    General: Skin is warm and dry.     Capillary  Refill: Capillary refill takes less than 2 seconds.     Coloration: Skin is not jaundiced or pale.     Findings: No bruising, erythema, lesion or rash.  Neurological:     General: No focal deficit present.     Mental Status: He is alert and oriented to person, place, and time.     Cranial Nerves: No cranial nerve deficit.     Sensory: No sensory deficit.     Motor: No weakness.     Coordination: Coordination normal.     Gait: Gait normal.     Deep Tendon Reflexes: Reflexes normal.  Psychiatric:        Mood and Affect: Mood normal.        Behavior: Behavior normal.        Thought Content: Thought content normal.        Judgment: Judgment normal.     Results for orders placed or performed in visit on 09/18/23  Microalbumin, Urine Waived   Collection Time: 09/18/23 10:10 AM  Result Value Ref Range   Microalb, Ur Waived 10 0 - 19 mg/L   Creatinine, Urine Waived 50 10 - 300 mg/dL   Microalb/Creat Ratio 30-300 (H) <30 mg/g  Comprehensive metabolic panel   Collection Time: 09/18/23 10:11 AM  Result Value Ref Range   Glucose 94 70 - 99 mg/dL   BUN 16 6 - 24 mg/dL   Creatinine, Ser 8.85 0.76 - 1.27 mg/dL   eGFR 80 >40 fO/fpw/8.26   BUN/Creatinine Ratio 14 9 - 20   Sodium 139 134 - 144 mmol/L   Potassium 4.4 3.5 - 5.2 mmol/L   Chloride 104 96 - 106 mmol/L   CO2 24 20 - 29 mmol/L   Calcium  9.2 8.7 - 10.2 mg/dL   Total Protein 6.8 6.0 - 8.5 g/dL   Albumin  4.4 4.1 - 5.1 g/dL   Globulin, Total 2.4 1.5 - 4.5 g/dL   Bilirubin Total 0.5 0.0 - 1.2 mg/dL   Alkaline Phosphatase 66 44 - 121 IU/L   AST 18 0 - 40 IU/L   ALT 19 0 - 44 IU/L  CBC with Differential/Platelet   Collection Time: 09/18/23 10:11 AM  Result Value Ref Range   WBC 5.0 3.4 - 10.8 x10E3/uL   RBC 4.88 4.14 - 5.80 x10E6/uL   Hemoglobin 15.1 13.0 - 17.7 g/dL   Hematocrit 54.7 62.4 - 51.0 %   MCV 93 79 - 97 fL   MCH 30.9 26.6 - 33.0 pg   MCHC 33.4 31.5 - 35.7 g/dL   RDW 87.2 88.3 - 84.5 %   Platelets 234 150 - 450  x10E3/uL   Neutrophils 48 Not Estab. %   Lymphs 42 Not Estab. %   Monocytes 8 Not Estab. %   Eos 1 Not Estab. %   Basos 1 Not Estab. %   Neutrophils Absolute 2.4 1.4 - 7.0 x10E3/uL   Lymphocytes Absolute 2.1 0.7 - 3.1 x10E3/uL   Monocytes Absolute 0.4 0.1 - 0.9 x10E3/uL   EOS (ABSOLUTE) 0.1 0.0 - 0.4 x10E3/uL   Basophils Absolute 0.0 0.0 - 0.2 x10E3/uL   Immature Granulocytes 0 Not Estab. %   Immature Grans (Abs) 0.0 0.0 - 0.1 x10E3/uL  Lipid Panel w/o Chol/HDL Ratio   Collection Time: 09/18/23 10:11 AM  Result Value Ref Range   Cholesterol, Total 226 (H) 100 - 199 mg/dL   Triglycerides 80 0 - 149 mg/dL   HDL 42 >60 mg/dL   VLDL Cholesterol Cal 14 5 - 40 mg/dL   LDL Chol Calc (NIH) 829 (H) 0 - 99 mg/dL  PSA   Collection Time: 09/18/23 10:11 AM  Result Value Ref Range   Prostate Specific Ag, Serum 0.8 0.0 - 4.0 ng/mL  TSH   Collection Time: 09/18/23 10:11 AM  Result Value Ref Range   TSH 2.580 0.450 - 4.500 uIU/mL      Assessment & Plan:   Problem List Items Addressed This Visit       Cardiovascular and Mediastinum   HTN (hypertension)   Doing well not on medicine. Continue to monitor. Call with any concerns. Labs drawn today.       Relevant Orders   Microalbumin, Urine Waived (Completed)     Endocrine   Hypogonadism in male   Will get him back in for labs in the next month. Adjust dose as needed.       Relevant Orders   Testosterone , free, total([facility])     Other   HLD (hyperlipidemia)   Doing well not on medicine. Continue to monitor. Call with any concerns. Labs drawn today.       Other Visit Diagnoses       Routine general medical examination at a health care facility    -  Primary   Vaccines up to date. Screening labs checked today. Colonoscopy up to date. Continue diet and exercise. Call with any concerns.   Relevant Orders   Comprehensive metabolic panel (Completed)   CBC with Differential/Platelet (Completed)   Lipid Panel w/o Chol/HDL  Ratio (Completed)   PSA (Completed)   TSH (Completed)       LABORATORY TESTING:  Health maintenance labs ordered today as discussed above.   The natural history of prostate cancer and ongoing controversy regarding screening and potential treatment outcomes of prostate cancer has been discussed with the patient.  The meaning of a false positive PSA and a false negative PSA has been discussed. He indicates understanding of the limitations of this screening test and wishes to proceed with screening PSA testing.   IMMUNIZATIONS:   - Tdap: Tetanus vaccination status reviewed: last tetanus booster within 10 years. - Influenza: Refused - Pneumovax: Not applicable - Prevnar: Not applicable - COVID: Refused - HPV: Not applicable - Shingrix vaccine: Not applicable  SCREENING: - Colonoscopy: Up to date  Discussed with patient purpose of the colonoscopy is to detect colon cancer at curable precancerous or early stages   PATIENT COUNSELING:    Sexuality: Discussed sexually transmitted diseases, partner selection, use of condoms, avoidance of unintended pregnancy  and contraceptive alternatives.   Advised to avoid cigarette smoking.  I discussed with the patient that most people either abstain from alcohol or drink within safe limits (<=14/week and <=4 drinks/occasion for males, <=7/weeks and <= 3 drinks/occasion for females) and that the risk for alcohol disorders and other health effects rises proportionally with the number of drinks per week and how often a drinker exceeds daily limits.  Discussed cessation/primary prevention of drug use and availability of treatment for abuse.   Diet: Encouraged to adjust caloric intake to maintain  or achieve ideal body weight, to reduce intake of dietary saturated fat and total fat, to limit sodium intake by avoiding high sodium foods and not adding table salt, and to maintain adequate dietary potassium and calcium  preferably from fresh fruits, vegetables,  and low-fat dairy products.    stressed the importance of regular exercise  Injury prevention: Discussed safety belts, safety helmets, smoke detector, smoking near bedding or upholstery.   Dental health: Discussed importance of regular tooth brushing, flossing, and dental visits.   Follow up plan: NEXT PREVENTATIVE PHYSICAL DUE IN 1 YEAR. Return in about 6 months (around 03/17/2024).

## 2023-09-19 LAB — CBC WITH DIFFERENTIAL/PLATELET
Basophils Absolute: 0 10*3/uL (ref 0.0–0.2)
Basos: 1 %
EOS (ABSOLUTE): 0.1 10*3/uL (ref 0.0–0.4)
Eos: 1 %
Hematocrit: 45.2 % (ref 37.5–51.0)
Hemoglobin: 15.1 g/dL (ref 13.0–17.7)
Immature Grans (Abs): 0 10*3/uL (ref 0.0–0.1)
Immature Granulocytes: 0 %
Lymphocytes Absolute: 2.1 10*3/uL (ref 0.7–3.1)
Lymphs: 42 %
MCH: 30.9 pg (ref 26.6–33.0)
MCHC: 33.4 g/dL (ref 31.5–35.7)
MCV: 93 fL (ref 79–97)
Monocytes Absolute: 0.4 10*3/uL (ref 0.1–0.9)
Monocytes: 8 %
Neutrophils Absolute: 2.4 10*3/uL (ref 1.4–7.0)
Neutrophils: 48 %
Platelets: 234 10*3/uL (ref 150–450)
RBC: 4.88 x10E6/uL (ref 4.14–5.80)
RDW: 12.7 % (ref 11.6–15.4)
WBC: 5 10*3/uL (ref 3.4–10.8)

## 2023-09-19 LAB — COMPREHENSIVE METABOLIC PANEL
ALT: 19 [IU]/L (ref 0–44)
AST: 18 [IU]/L (ref 0–40)
Albumin: 4.4 g/dL (ref 4.1–5.1)
Alkaline Phosphatase: 66 [IU]/L (ref 44–121)
BUN/Creatinine Ratio: 14 (ref 9–20)
BUN: 16 mg/dL (ref 6–24)
Bilirubin Total: 0.5 mg/dL (ref 0.0–1.2)
CO2: 24 mmol/L (ref 20–29)
Calcium: 9.2 mg/dL (ref 8.7–10.2)
Chloride: 104 mmol/L (ref 96–106)
Creatinine, Ser: 1.14 mg/dL (ref 0.76–1.27)
Globulin, Total: 2.4 g/dL (ref 1.5–4.5)
Glucose: 94 mg/dL (ref 70–99)
Potassium: 4.4 mmol/L (ref 3.5–5.2)
Sodium: 139 mmol/L (ref 134–144)
Total Protein: 6.8 g/dL (ref 6.0–8.5)
eGFR: 80 mL/min/{1.73_m2} (ref >59)

## 2023-09-19 LAB — PSA: Prostate Specific Ag, Serum: 0.8 ng/mL (ref 0.0–4.0)

## 2023-09-19 LAB — LIPID PANEL W/O CHOL/HDL RATIO
Cholesterol, Total: 226 mg/dL — ABNORMAL HIGH (ref 100–199)
HDL: 42 mg/dL (ref >39)
LDL Chol Calc (NIH): 170 mg/dL — ABNORMAL HIGH (ref 0–99)
Triglycerides: 80 mg/dL (ref 0–149)
VLDL Cholesterol Cal: 14 mg/dL (ref 5–40)

## 2023-09-19 LAB — TSH: TSH: 2.58 u[IU]/mL (ref 0.450–4.500)

## 2023-09-21 ENCOUNTER — Encounter: Payer: Self-pay | Admitting: Family Medicine

## 2023-09-21 NOTE — Assessment & Plan Note (Signed)
 Doing well not on medicine. Continue to monitor. Call with any concerns. Labs drawn today.

## 2023-09-21 NOTE — Assessment & Plan Note (Signed)
 Will get him back in for labs in the next month. Adjust dose as needed.

## 2023-09-23 ENCOUNTER — Ambulatory Visit: Payer: BC Managed Care – PPO | Admitting: Nurse Practitioner

## 2023-09-29 ENCOUNTER — Encounter: Payer: BC Managed Care – PPO | Admitting: Family Medicine

## 2023-10-21 ENCOUNTER — Other Ambulatory Visit: Payer: BC Managed Care – PPO

## 2023-10-21 DIAGNOSIS — E291 Testicular hypofunction: Secondary | ICD-10-CM

## 2023-10-25 LAB — TESTOSTERONE, FREE, TOTAL, SHBG
Sex Hormone Binding: 30.1 nmol/L (ref 16.5–55.9)
Testosterone, Free: 6.7 pg/mL — ABNORMAL LOW (ref 6.8–21.5)
Testosterone: 288 ng/dL (ref 264–916)

## 2023-11-01 ENCOUNTER — Encounter: Payer: Self-pay | Admitting: Family Medicine

## 2023-11-01 ENCOUNTER — Other Ambulatory Visit: Payer: Self-pay | Admitting: Family Medicine

## 2023-11-01 MED ORDER — TESTOSTERONE CYPIONATE 200 MG/ML IM SOLN: INTRAMUSCULAR | 5 refills | Status: DC

## 2023-11-30 MED ORDER — "LUER LOCK SAFETY SYRINGES 21G X 1-1/2"" 3 ML MISC": 1.0000 | 1 refills | Status: AC

## 2024-03-03 ENCOUNTER — Encounter: Payer: Self-pay | Admitting: Family Medicine

## 2024-03-17 ENCOUNTER — Ambulatory Visit: Payer: Self-pay | Admitting: Family Medicine

## 2024-03-23 ENCOUNTER — Ambulatory Visit: Admitting: Family Medicine

## 2024-03-30 ENCOUNTER — Telehealth: Admitting: Physician Assistant

## 2024-03-30 DIAGNOSIS — H60391 Other infective otitis externa, right ear: Secondary | ICD-10-CM | POA: Diagnosis not present

## 2024-03-30 MED ORDER — AMOXICILLIN-POT CLAVULANATE 875-125 MG PO TABS: 1.0000 | ORAL_TABLET | Freq: Two times a day (BID) | ORAL | 0 refills | Status: DC

## 2024-03-30 MED ORDER — HYDROCORTISONE-ACETIC ACID 1-2 % OT SOLN: 3.0000 [drp] | Freq: Four times a day (QID) | OTIC | 0 refills | Status: AC

## 2024-03-30 NOTE — Progress Notes (Signed)
 E Visit for Ear Pain - Swimmer's Ear  We are sorry that you are not feeling well. Here is how we plan to help!  Based on what you have shared with me it looks like you have Swimmer's Ear.  Swimmer's ear is a redness or swelling, irritation, or infection of your outer ear canal. These symptoms usually occur within a few days of swimming. Your ear canal is a tube that goes from the opening of the ear to the eardrum.  When water stays in your ear canal, germs can grow.  This is a painful condition that often happens to children and swimmers of all ages.  It is not contagious and oral antibiotics are not required to treat uncomplicated swimmer's ear.  The usual symptoms include:    Itchiness inside the ear  Redness or a sense of swelling in the ear  Pain when the ear is tugged on when pressure is placed on the ear  Pus draining from the infected ear   I have prescribed Augmentin 875-125 mg one tablet by mouth twice a day for 10 days  I have prescribed Acetic acid 2% and hydrocortisone 1% otic solution 3 drops in affected ear(s) every four times per day for 7 days  In certain cases, swimmer's ear may progress to a more serious bacterial infection of the middle or inner ear.  If you have a fever 102 and up and significantly worsening symptoms, this could indicate a more serious infection moving to the middle/inner and needs face to face evaluation in an office by a provider.  Your symptoms should improve over the next 3 days and should resolve in about 7 days.  Be sure to complete ALL of your prescription.  HOME CARE: Wash your hands frequently. If you are prescribed an ear drop, do not place the tip of the bottle on your ear or touch it with your fingers. You can take Acetaminophen 650 mg every 4-6 hours as needed for pain.  If pain is severe or moderate, you can apply a heating pad (set on low) or hot water bottle (wrapped in a towel) to outer ear for 20 minutes.  This will also increase  drainage. Avoid ear plugs Do not go swimming until the symptoms are gone Do not use Q-tips After showers, help the water run out by tilting your head to one side.   GET HELP RIGHT AWAY IF: Fever is over 102.2 degrees. You develop progressive ear pain or hearing loss. Ear symptoms persist longer than 3 days after treatment.  MAKE SURE YOU: Understand these instructions. Will watch your condition. Will get help right away if you are not doing well or get worse.  TO PREVENT SWIMMER'S EAR: Use a bathing cap or custom fitted swim molds to keep your ears dry. Towel off after swimming to dry your ears. Tilt your head or pull your earlobes to allow the water to escape your ear canal. If there is still water in your ears, consider using a hairdryer on the lowest setting.  Thank you for choosing an e-visit.  Your e-visit answers were reviewed by a board certified advanced clinical practitioner to complete your personal care plan. Depending upon the condition, your plan could have included both over the counter or prescription medications.  Please review your pharmacy choice. Make sure the pharmacy is open so you can pick up the prescription now. If there is a problem, you may contact your provider through [employer] and have the prescription routed  to another pharmacy.  Your safety is important to [place]. If you have drug allergies check your prescription carefully.   For the next 24 hours you can use MyChart to ask questions about today's visit, request a non-urgent call back, or ask for a work or school excuse. You will get an email with a survey after your eVisit asking about your experience. We would appreciate your feedback. I hope that your e-visit has been valuable and will aid in your recovery.

## 2024-03-30 NOTE — Progress Notes (Signed)
 Message sent to patient requesting further input regarding current symptoms. Awaiting patient response.

## 2024-03-30 NOTE — Progress Notes (Signed)
 I have spent 5 minutes in review of e-visit questionnaire, review and updating patient chart, medical decision making and response to patient.   Piedad Climes, PA-C

## 2024-05-06 ENCOUNTER — Encounter: Payer: Self-pay | Admitting: Family Medicine

## 2024-05-06 ENCOUNTER — Ambulatory Visit: Admitting: Family Medicine

## 2024-05-06 VITALS — BP 130/92 | HR 69 | Temp 98.0°F | Ht 71.0 in | Wt 240.0 lb

## 2024-05-06 DIAGNOSIS — Z789 Other specified health status: Secondary | ICD-10-CM

## 2024-05-06 DIAGNOSIS — E782 Mixed hyperlipidemia: Secondary | ICD-10-CM | POA: Diagnosis not present

## 2024-05-06 DIAGNOSIS — E291 Testicular hypofunction: Secondary | ICD-10-CM | POA: Diagnosis not present

## 2024-05-06 DIAGNOSIS — I10 Essential (primary) hypertension: Secondary | ICD-10-CM | POA: Diagnosis not present

## 2024-05-06 NOTE — Assessment & Plan Note (Signed)
 Rechecking labs today. Await results. Treat as needed.

## 2024-05-06 NOTE — Assessment & Plan Note (Signed)
 Running a little high- will work on diet and exercise and monitor at home. Call with any concerns.

## 2024-05-06 NOTE — Progress Notes (Signed)
 BP (!) 130/92   Pulse 69   Temp 98 F (36.7 C) (Oral)   Ht 5' 11 (1.803 m)   Wt 240 lb (108.9 kg)   SpO2 98%   BMI 33.47 kg/m    Subjective:    Patient ID: Matthew Hartman, male    DOB: 1975/09/25, 48 y.o.   MRN: [id number]  HPI: Matthew Hartman is a 48 y.o. male  Chief Complaint  Patient presents with   Hypertension   Hyperlipidemia   Hypogonadism   LOW TESTOSTERONE  Duration: chronic Status: controlled  Satisfied with current treatment:  yes Previous testosterone  therapies: androgel , IM testosterone  Medication side effects:  no Medication compliance: excellent compliance Decreased libido: no Fatigue: yes Depressed mood: no Muscle weakness: no Erectile dysfunction: no  HYPERTENSION / HYPERLIPIDEMIA Satisfied with current treatment? yes Duration of hypertension: chronic BP monitoring frequency: not checking BP medication side effects: no Past BP meds: none Duration of hyperlipidemia: chronic Cholesterol medication side effects: no Cholesterol supplements: none Past cholesterol medications: none Medication compliance: excellent compliance Aspirin: no Recent stressors: no Recurrent headaches: no Visual changes: no Palpitations: no Dyspnea: no Chest pain: no Lower extremity edema: no Dizzy/lightheaded: no  Relevant past medical, surgical, family and social history reviewed and updated as indicated. Interim medical history since our last visit reviewed. Allergies and medications reviewed and updated.  Review of Systems  Constitutional: Negative.   Respiratory: Negative.    Cardiovascular: Negative.   Musculoskeletal: Negative.   Neurological: Negative.   Psychiatric/Behavioral: Negative.      Per HPI unless specifically indicated above     Objective:    BP (!) 130/92   Pulse 69   Temp 98 F (36.7 C) (Oral)   Ht 5' 11 (1.803 m)   Wt 240 lb (108.9 kg)   SpO2 98%   BMI 33.47 kg/m   Wt Readings from Last 3 Encounters:  05/06/24 240 lb  (108.9 kg)  09/18/23 218 lb (98.9 kg)  08/13/23 226 lb (102.5 kg)    Physical Exam Vitals and nursing note reviewed.  Constitutional:      General: He is not in acute distress.    Appearance: Normal appearance. He is not ill-appearing, toxic-appearing or diaphoretic.  HENT:     Head: Normocephalic and atraumatic.     Right Ear: External ear normal.     Left Ear: External ear normal.     Nose: Nose normal.     Mouth/Throat:     Mouth: Mucous membranes are moist.     Pharynx: Oropharynx is clear.  Eyes:     General: No scleral icterus.       Right eye: No discharge.        Left eye: No discharge.     Extraocular Movements: Extraocular movements intact.     Conjunctiva/sclera: Conjunctivae normal.     Pupils: Pupils are equal, round, and reactive to light.  Cardiovascular:     Rate and Rhythm: Normal rate and regular rhythm.     Pulses: Normal pulses.     Heart sounds: Normal heart sounds. No murmur heard.    No friction rub. No gallop.  Pulmonary:     Effort: Pulmonary effort is normal. No respiratory distress.     Breath sounds: Normal breath sounds. No stridor. No wheezing, rhonchi or rales.  Chest:     Chest wall: No tenderness.  Musculoskeletal:        General: Normal range of motion.     Cervical back: Normal  range of motion and neck supple.  Skin:    General: Skin is warm and dry.     Capillary Refill: Capillary refill takes less than 2 seconds.     Coloration: Skin is not jaundiced or pale.     Findings: No bruising, erythema, lesion or rash.  Neurological:     General: No focal deficit present.     Mental Status: He is alert and oriented to person, place, and time. Mental status is at baseline.  Psychiatric:        Mood and Affect: Mood normal.        Behavior: Behavior normal.        Thought Content: Thought content normal.        Judgment: Judgment normal.     Results for orders placed or performed in visit on 10/21/23  Testosterone , free,  total([facility])   Collection Time: 10/21/23 10:46 AM  Result Value Ref Range   Testosterone  288 264 - 916 ng/dL   Testosterone , Free 6.7 (L) 6.8 - 21.5 pg/mL   Sex Hormone Binding 30.1 16.5 - 55.9 nmol/L      Assessment & Plan:   Problem List Items Addressed This Visit       Cardiovascular and Mediastinum   HTN (hypertension)   Running a little high- will work on diet and exercise and monitor at home. Call with any concerns.       Relevant Orders   CBC with Differential/Platelet   Comprehensive metabolic panel with GFR     Endocrine   Hypogonadism in male - Primary   Rechecking labs today. Await results. Treat as needed.       Relevant Orders   CBC with Differential/Platelet   Comprehensive metabolic panel with GFR   Testosterone , free, total([facility])   PSA     Other   HLD (hyperlipidemia)   Rechecking labs today. Await results. Treat as needed.        Relevant Orders   CBC with Differential/Platelet   Comprehensive metabolic panel with GFR   Lipid Panel w/o Chol/HDL Ratio   Other Visit Diagnoses       Hepatitis B vaccination status unknown       Rechecking labs today. Await results. Treat as needed.   Relevant Orders   Hepatitis B surface antibody,quantitative        Follow up plan: Return in about 6 months (around 11/05/2024) for physical.

## 2024-05-10 ENCOUNTER — Ambulatory Visit: Payer: Self-pay | Admitting: Family Medicine

## 2024-05-10 DIAGNOSIS — E291 Testicular hypofunction: Secondary | ICD-10-CM

## 2024-05-10 MED ORDER — TESTOSTERONE CYPIONATE 200 MG/ML IM SOLN: 200.0000 mg | INTRAMUSCULAR | 0 refills | Status: AC

## 2024-05-10 NOTE — Progress Notes (Signed)
 Called patient and left a message for him to call back to get scheduled for.SABRASABRASABRAAM lab only visit (before 11AM) in 1-2 months

## 2024-05-12 NOTE — Progress Notes (Signed)
 2nd attempt. Called patient and left a message for him to call back to get scheduled for.SABRASABRASABRAAM lab only visit (before 11AM) in 1-2 months

## 2024-05-13 NOTE — Progress Notes (Signed)
 3rd attempt called patient, left vm for patient to call and schedule AM lab only visit (before 11AM) in 1-2 months

## 2024-05-14 LAB — CBC WITH DIFFERENTIAL/PLATELET
Basophils Absolute: 0 x10E3/uL (ref 0.0–0.2)
Basos: 1 %
EOS (ABSOLUTE): 0 x10E3/uL (ref 0.0–0.4)
Eos: 1 %
Hematocrit: 52.9 % — ABNORMAL HIGH (ref 37.5–51.0)
Hemoglobin: 17.3 g/dL (ref 13.0–17.7)
Immature Grans (Abs): 0 x10E3/uL (ref 0.0–0.1)
Immature Granulocytes: 0 %
Lymphocytes Absolute: 2 x10E3/uL (ref 0.7–3.1)
Lymphs: 30 %
MCH: 30.7 pg (ref 26.6–33.0)
MCHC: 32.7 g/dL (ref 31.5–35.7)
MCV: 94 fL (ref 79–97)
Monocytes Absolute: 0.6 x10E3/uL (ref 0.1–0.9)
Monocytes: 8 %
Neutrophils Absolute: 4 x10E3/uL (ref 1.4–7.0)
Neutrophils: 60 %
Platelets: 228 x10E3/uL (ref 150–450)
RBC: 5.63 x10E6/uL (ref 4.14–5.80)
RDW: 13.3 % (ref 11.6–15.4)
WBC: 6.6 x10E3/uL (ref 3.4–10.8)

## 2024-05-14 LAB — COMPREHENSIVE METABOLIC PANEL WITH GFR
ALT: 28 IU/L (ref 0–44)
AST: 28 IU/L (ref 0–40)
Albumin: 4.3 g/dL (ref 4.1–5.1)
Alkaline Phosphatase: 59 IU/L (ref 44–121)
BUN/Creatinine Ratio: 10 (ref 9–20)
BUN: 12 mg/dL (ref 6–24)
Bilirubin Total: 0.6 mg/dL (ref 0.0–1.2)
CO2: 22 mmol/L (ref 20–29)
Calcium: 9.2 mg/dL (ref 8.7–10.2)
Chloride: 103 mmol/L (ref 96–106)
Creatinine, Ser: 1.23 mg/dL (ref 0.76–1.27)
Globulin, Total: 2.7 g/dL (ref 1.5–4.5)
Glucose: 86 mg/dL (ref 70–99)
Potassium: 4.6 mmol/L (ref 3.5–5.2)
Sodium: 139 mmol/L (ref 134–144)
Total Protein: 7 g/dL (ref 6.0–8.5)
eGFR: 73 mL/min/1.73 (ref >59)

## 2024-05-14 LAB — LIPID PANEL W/O CHOL/HDL RATIO
Cholesterol, Total: 223 mg/dL — ABNORMAL HIGH (ref 100–199)
HDL: 38 mg/dL — ABNORMAL LOW (ref >39)
LDL Chol Calc (NIH): 168 mg/dL — ABNORMAL HIGH (ref 0–99)
Triglycerides: 95 mg/dL (ref 0–149)
VLDL Cholesterol Cal: 17 mg/dL (ref 5–40)

## 2024-05-14 LAB — HEPATITIS B SURFACE ANTIBODY, QUANTITATIVE: Hepatitis B Surf Ab Quant: <3.5 m[IU]/mL — ABNORMAL LOW

## 2024-05-14 LAB — TESTOSTERONE, FREE, TOTAL, SHBG
Sex Hormone Binding: 21.3 nmol/L (ref 16.5–55.9)
Testosterone, Free: 31.8 pg/mL — ABNORMAL HIGH (ref 6.8–21.5)
Testosterone: 1029 ng/dL — ABNORMAL HIGH (ref 264–916)

## 2024-05-14 LAB — PSA: Prostate Specific Ag, Serum: 0.9 ng/mL (ref 0.0–4.0)

## 2024-05-24 ENCOUNTER — Encounter: Payer: Self-pay | Admitting: Family Medicine

## 2024-05-30 ENCOUNTER — Telehealth: Payer: Self-pay

## 2024-05-30 ENCOUNTER — Other Ambulatory Visit: Payer: Self-pay | Admitting: Family Medicine

## 2024-05-30 DIAGNOSIS — Z111 Encounter for screening for respiratory tuberculosis: Secondary | ICD-10-CM

## 2024-05-30 NOTE — Telephone Encounter (Signed)
 Copied from CRM #[id number]. Topic: General - Other >> May 30, 2024  9:07 AM Delon HERO wrote: Reason for CRM: Patient is calling to report that he is in the process of fostering. Uploaded general medical examination form. Last CPE 09/18/2023. Can Dr. Vicci please complete? This is the last step in the process.

## 2024-05-31 ENCOUNTER — Other Ambulatory Visit

## 2024-05-31 DIAGNOSIS — Z111 Encounter for screening for respiratory tuberculosis: Secondary | ICD-10-CM

## 2024-06-04 LAB — QUANTIFERON-TB GOLD PLUS
QuantiFERON Mitogen Value: >10 [IU]/mL
QuantiFERON Nil Value: 0.22 [IU]/mL
QuantiFERON TB1 Ag Value: 0.22 [IU]/mL
QuantiFERON TB2 Ag Value: 0.19 [IU]/mL
QuantiFERON-TB Gold Plus: NEGATIVE

## 2024-06-08 ENCOUNTER — Telehealth: Payer: Self-pay

## 2024-06-08 NOTE — Telephone Encounter (Signed)
 Sent mychart to pt about completed form

## 2024-06-08 NOTE — Telephone Encounter (Signed)
 Copied from CRM #[id number]. Topic: General - Other >> Jun 08, 2024  9:25 AM Frederich PARAS wrote: Reason for CRM: PT Calling in to get the status of the medical form , they are becoming foster parents, he got TB test and he got the results but he still have not received the medical evaluation form , he adv it can be sen directly to him or upload to [facility] CHART. Reached out to CAL. The cma was busy with a pt.

## 2024-06-10 ENCOUNTER — Ambulatory Visit: Payer: Self-pay | Admitting: Family Medicine

## 2024-07-18 ENCOUNTER — Ambulatory Visit (INDEPENDENT_AMBULATORY_CARE_PROVIDER_SITE_OTHER): Admitting: Family Medicine

## 2024-07-18 ENCOUNTER — Encounter: Payer: Self-pay | Admitting: Family Medicine

## 2024-07-18 VITALS — BP 143/100 | HR 72 | Temp 97.6°F | Ht 71.0 in | Wt 246.2 lb

## 2024-07-18 DIAGNOSIS — Z9889 Other specified postprocedural states: Secondary | ICD-10-CM

## 2024-07-18 DIAGNOSIS — M5416 Radiculopathy, lumbar region: Secondary | ICD-10-CM

## 2024-07-18 DIAGNOSIS — I10 Essential (primary) hypertension: Secondary | ICD-10-CM

## 2024-07-18 DIAGNOSIS — H9313 Tinnitus, bilateral: Secondary | ICD-10-CM | POA: Insufficient documentation

## 2024-07-18 DIAGNOSIS — M5412 Radiculopathy, cervical region: Secondary | ICD-10-CM

## 2024-07-18 MED ORDER — LOSARTAN POTASSIUM 25 MG PO TABS: 25.0000 mg | ORAL_TABLET | Freq: Every day | ORAL | 0 refills | Status: AC

## 2024-07-18 NOTE — Assessment & Plan Note (Signed)
 Chronic. Needs NEXUS letter- will provide.

## 2024-07-18 NOTE — Assessment & Plan Note (Signed)
 Running high. Will start him on losartan and recheck in 1 month.

## 2024-07-18 NOTE — Progress Notes (Signed)
 BP (!) 143/100   Pulse 72   Temp 97.6 F (36.4 C) (Oral)   Ht 5' 11 (1.803 m)   Wt 246 lb 4 oz (111.7 kg)   SpO2 98%   BMI 34.34 kg/m    Subjective:    Patient ID: Matthew Hartman, male    DOB: 12-Feb-1976, 48 y.o.   MRN: [id number]  HPI: Matthew Hartman is a 48 y.o. male  Chief Complaint  Patient presents with   Hypertension   HYPERTENSION  Hypertension status: uncontrolled  Satisfied with current treatment? 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 He was carrying 1200lb boats on top of his head while walking in sand. He has had issues with his knees, back, neck- has had 2 neck surgeries in the past. He had 3 knee surgeries right after he got out. He has seen ortho several times over the years. He has been having joint and neck and back pain consistently. He does lift weights and he lifts heavy. Tinnitus due to noise exposure. He deals with all of this, but it's becoming more of a problem as he gets older. No other concerns or complaints at this time.    Relevant past medical, surgical, family and social history reviewed and updated as indicated. Interim medical history since our last visit reviewed. Allergies and medications reviewed and updated.  Review of Systems  Constitutional: Negative.   HENT:  Positive for tinnitus. Negative for congestion, dental problem, drooling, ear discharge, ear pain, facial swelling, hearing loss, mouth sores, nosebleeds, postnasal drip, rhinorrhea, sinus pressure, sinus pain, sneezing, sore throat,  trouble swallowing and voice change.   Respiratory: Negative.    Cardiovascular: Negative.   Musculoskeletal:  Positive for arthralgias, back pain, neck pain and neck stiffness. Negative for gait problem, joint swelling and myalgias.  Neurological: Negative.   Psychiatric/Behavioral: Negative.      Per HPI unless specifically indicated above     Objective:    BP (!) 143/100   Pulse 72   Temp 97.6 F (36.4 C) (Oral)   Ht 5' 11 (1.803 m)   Wt 246 lb 4 oz (111.7 kg)   SpO2 98%   BMI 34.34 kg/m   Wt Readings from Last 3 Encounters:  07/18/24 246 lb 4 oz (111.7 kg)  05/06/24 240 lb (108.9 kg)  09/18/23 218 lb (98.9 kg)    Physical Exam Vitals and nursing note reviewed.  Constitutional:      General: He is not in acute distress.    Appearance: Normal appearance. He is not ill-appearing, toxic-appearing or diaphoretic.  HENT:     Head: Normocephalic and atraumatic.     Right Ear: External ear normal.     Left Ear: External ear normal.     Nose: Nose normal.     Mouth/Throat:     Mouth: Mucous membranes are moist.     Pharynx: Oropharynx is clear.  Eyes:     General: No scleral icterus.       Right eye: No discharge.        Left eye: No discharge.  Extraocular Movements: Extraocular movements intact.     Conjunctiva/sclera: Conjunctivae normal.     Pupils: Pupils are equal, round, and reactive to light.  Cardiovascular:     Rate and Rhythm: Normal rate and regular rhythm.     Pulses: Normal pulses.     Heart sounds: Normal heart sounds. No murmur heard.    No friction rub. No gallop.  Pulmonary:     Effort: Pulmonary effort is normal. No respiratory distress.     Breath sounds: Normal breath sounds. No stridor. No wheezing, rhonchi or rales.  Chest:     Chest wall: No tenderness.  Musculoskeletal:        General: Normal range of motion.     Cervical back: Normal range of motion and neck supple.  Skin:    General: Skin is warm and dry.     Capillary Refill:  Capillary refill takes less than 2 seconds.     Coloration: Skin is not jaundiced or pale.     Findings: No bruising, erythema, lesion or rash.  Neurological:     General: No focal deficit present.     Mental Status: He is alert and oriented to person, place, and time. Mental status is at baseline.  Psychiatric:        Mood and Affect: Mood normal.        Behavior: Behavior normal.        Thought Content: Thought content normal.        Judgment: Judgment normal.     Results for orders placed or performed in visit on 05/31/24  QuantiFERON-TB Gold Plus   Collection Time: 05/31/24  9:18 AM  Result Value Ref Range   QuantiFERON Incubation Incubation performed.    QuantiFERON Criteria Comment    QuantiFERON TB1 Ag Value 0.22 IU/mL   QuantiFERON TB2 Ag Value 0.19 IU/mL   QuantiFERON Nil Value 0.22 IU/mL   QuantiFERON Mitogen Value >10.00 IU/mL   QuantiFERON-TB Gold Plus Negative Negative      Assessment & Plan:   Problem List Items Addressed This Visit       Cardiovascular and Mediastinum   HTN (hypertension) - Primary   Running high. Will start him on losartan and recheck in 1 month.       Relevant Medications   losartan (COZAAR) 25 MG tablet     Nervous and Auditory   Cervical radiculopathy   Chronic. Needs NEXUS letter- will provide.       Lumbar radiculopathy   Chronic. Needs NEXUS letter- will provide.         Other   History of arthroscopy of knee   Chronic. Needs NEXUS letter- will provide.       Tinnitus of both ears   Chronic. Needs NEXUS letter- will provide.         Follow up plan: Return in about 4 weeks (around 08/15/2024).

## 2024-07-28 ENCOUNTER — Encounter: Payer: Self-pay | Admitting: Family Medicine

## 2024-08-08 ENCOUNTER — Telehealth: Admitting: Family Medicine

## 2024-08-08 DIAGNOSIS — Z20828 Contact with and (suspected) exposure to other viral communicable diseases: Secondary | ICD-10-CM

## 2024-08-08 DIAGNOSIS — R051 Acute cough: Secondary | ICD-10-CM

## 2024-08-08 DIAGNOSIS — R52 Pain, unspecified: Secondary | ICD-10-CM

## 2024-08-08 DIAGNOSIS — Z20818 Contact with and (suspected) exposure to other bacterial communicable diseases: Secondary | ICD-10-CM

## 2024-08-08 MED ORDER — AMOXICILLIN 500 MG PO TABS: 500.0000 mg | ORAL_TABLET | Freq: Two times a day (BID) | ORAL | 0 refills | Status: AC

## 2024-08-08 MED ORDER — PROMETHAZINE-DM 6.25-15 MG/5ML PO SYRP: 5.0000 mL | ORAL_SOLUTION | Freq: Four times a day (QID) | ORAL | 0 refills | Status: AC | PRN

## 2024-08-08 MED ORDER — LIDOCAINE VISCOUS HCL 2 % MT SOLN: 15.0000 mL | OROMUCOSAL | 0 refills | Status: AC | PRN

## 2024-08-08 MED ORDER — OSELTAMIVIR PHOSPHATE 75 MG PO CAPS: 75.0000 mg | ORAL_CAPSULE | Freq: Two times a day (BID) | ORAL | 0 refills | Status: AC

## 2024-08-08 MED ORDER — BENZONATATE 100 MG PO CAPS: 100.0000 mg | ORAL_CAPSULE | Freq: Three times a day (TID) | ORAL | 0 refills | Status: AC | PRN

## 2024-08-08 NOTE — Progress Notes (Signed)
 E visit for Flu like symptoms   We are sorry that you are not feeling well.  Here is how we plan to help! Based on what you have shared with me it looks like you may have possible exposure to a virus that causes influenza.  Influenza or "the flu" is  an infection caused by a respiratory virus. The flu virus is highly contagious and persons who did not receive their yearly flu vaccination may "catch" the flu from close contact.  We have anti-viral medications to treat the viruses that cause this infection. They are not a "cure" and only shorten the course of the infection. These prescriptions are most effective when they are given within the first 2 days of "flu" symptoms. Antiviral medications are indicated if you have a high risk of complications from the flu. You should  also consider an antiviral medication if you are in close contact with someone who is at risk. These medications can help patients avoid complications from the flu but have side effects that you should know.   Possible side effects from Tamiflu or oseltamivir include nausea, vomiting, diarrhea, dizziness, headaches, eye redness, sleep problems or other respiratory symptoms. You should not take Tamiflu if you have an allergy to oseltamivir or any to the ingredients in Tamiflu.  Based upon your symptoms and potential risk factors I have prescribed Oseltamivir (Tamiflu).  It has been sent to your designated pharmacy.  You will take one 75 mg capsule orally twice a day for the next 5 days.   For nasal congestion, you may use an oral decongestant such as Mucinex D or if you have glaucoma or high blood pressure use plain Mucinex.  Saline nasal spray or nasal drops can help and can safely be used as often as needed for congestion.  If you have a sore or scratchy throat, use a saltwater gargle-  to  teaspoon of salt dissolved in a 4-ounce to 8-ounce glass of warm water.  Gargle the solution for approximately 15-30 seconds and then spit.   It is important not to swallow the solution.  You can also use throat lozenges/cough drops and Chloraseptic spray to help with throat pain or discomfort.  Warm or cold liquids can also be helpful in relieving throat pain.  For headache, pain or general discomfort, you can use Ibuprofen  or Tylenol  as directed.   Some authorities believe that zinc sprays or the use of Echinacea may shorten the course of your symptoms.  I have prescribed the following medications to help lessen symptoms: I have prescribed Tessalon  Perles 100 mg. You may take 1-2 capsules every 8 hours as needed for cough and I have prescribed Phenergan  DM 6.25 mg/15 mg. You make take one teaspoon / 5 ml every 4-6 hours as needed for cough   I will also treat you for strep to be safe.  Amoxicillin  and Mouth Swish to help throat pain  You are to isolate at home until you have been fever-free for at least 24 hours without a fever-reducing medication, and symptoms have been steadily improving for 24 hours.  If you must be around other household members who do not have symptoms, you need to make sure that both you and the family members are masking consistently with a high-quality mask.  If you note any worsening of symptoms despite treatment, please seek an in-person evaluation ASAP. If you note any significant shortness of breath or any chest pain, please seek ED evaluation. Please do not delay care!  ANYONE WHO HAS FLU SYMPTOMS SHOULD: Stay home. The flu is highly contagious and going out or to work exposes others! Be sure to drink plenty of fluids. Water is fine as well as fruit juices, sodas and electrolyte beverages. You may want to stay away from caffeine or alcohol. If you are nauseated, try taking small sips of liquids. How do you know if you are getting enough fluid? Your urine should be a pale yellow or almost colorless. Get rest. Taking a steamy shower or using a humidifier may help nasal congestion and ease sore throat pain.  Using a saline nasal spray works much the same way. Cough drops, hard candies and sore throat lozenges may ease your cough. Line up a caregiver. Have someone check on you regularly.  GET HELP RIGHT AWAY IF: You cannot keep down liquids or your medications. You become short of breath Your fell like you are going to pass out or loose consciousness. Your symptoms persist after you have completed your treatment plan  MAKE SURE YOU  Understand these instructions. Will watch your condition. Will get help right away if you are not doing well or get worse.  Your e-visit answers were reviewed by a board certified advanced clinical practitioner to complete your personal care plan.  Depending on the condition, your plan could have included both over the counter or prescription medications.  If there is a problem please reply  once you have received a response from your provider.  Your safety is important to us .  If you have drug allergies check your prescription carefully.    You can use MyChart to ask questions about today's visit, request a non-urgent call back, or ask for a work or school excuse for 24 hours related to this e-Visit. If it has been greater than 24 hours you will need to follow up with your provider, or enter a new e-Visit to address those concerns.  You will get an e-mail in the next two days asking about your experience.  I hope that your e-visit has been valuable and will speed your recovery. Thank you for using e-visits.   I have spent 5 minutes in review of e-visit questionnaire, review and updating patient chart, medical decision making and response to patient.   Chiquita CHRISTELLA Barefoot, NP

## 2024-08-17 ENCOUNTER — Ambulatory Visit: Admitting: Family Medicine

## 2024-08-17 NOTE — Telephone Encounter (Signed)
 It's up in the completed work bin up front

## 2024-11-08 ENCOUNTER — Encounter: Admitting: Family Medicine
